# Patient Record
Sex: Female | Born: 1985 | Hispanic: Yes | Marital: Single | Smoking: Never smoker
Health system: Southern US, Community
[De-identification: ages and names within clinical notes are randomized; demographics above are authoritative.]

## PROBLEM LIST (undated history)

## (undated) DIAGNOSIS — O24419 Gestational diabetes mellitus in pregnancy, unspecified control: Secondary | ICD-10-CM

## (undated) HISTORY — DX: Gestational diabetes mellitus in pregnancy, unspecified control: O24.419

## (undated) HISTORY — PX: CERVICAL CERCLAGE: SHX1329

---

## 2021-04-04 ENCOUNTER — Ambulatory Visit (INDEPENDENT_AMBULATORY_CARE_PROVIDER_SITE_OTHER): Payer: Medicaid Other | Admitting: Family Medicine

## 2021-04-04 ENCOUNTER — Other Ambulatory Visit: Payer: Self-pay

## 2021-04-04 VITALS — BP 117/67 | HR 84 | Ht 66.75 in | Wt 237.3 lb

## 2021-04-04 DIAGNOSIS — O021 Missed abortion: Secondary | ICD-10-CM

## 2021-04-04 DIAGNOSIS — Z3201 Encounter for pregnancy test, result positive: Secondary | ICD-10-CM | POA: Diagnosis not present

## 2021-04-04 DIAGNOSIS — Z3687 Encounter for antenatal screening for uncertain dates: Secondary | ICD-10-CM

## 2021-04-04 DIAGNOSIS — O219 Vomiting of pregnancy, unspecified: Secondary | ICD-10-CM

## 2021-04-04 LAB — POCT PREGNANCY, URINE: Preg Test, Ur: POSITIVE — AB

## 2021-04-04 MED ORDER — PROMETHAZINE HCL 25 MG PO TABS: 25.0000 mg | ORAL_TABLET | Freq: Four times a day (QID) | ORAL | 0 refills | Status: DC | PRN

## 2021-04-04 NOTE — Progress Notes (Signed)
Chart reviewed for nurse visit. Agree with plan of care.   Patient seen to establish prenatal care. Depending on dates at time of dating ultrasound will need cervical length and MFM consult as well. ROI for records from [place], [place].  Venora Maples, MD 04/04/2021 1:07 PM

## 2021-04-04 NOTE — Progress Notes (Signed)
Here for pregnancy test which was positive. Reports LMP approximately 02/08/21 within days.  States she has irregular periods. States has a one year old, breastfeed x 5 months and only had 2 periods. Is sure LMP around 02/08/21. This makes her approximately [redacted]w[redacted]d with EDD 11/15/21. Advised to start prenatal vitamins, Advised to start prenatal care. List given. Dr.Eckstat in to see patient since she has not been seen in our office before. and ordered dating [place]. And to check cervical length due to hx having cerclage. Also reports bleeding throughout 2nd pregnancy.  States 2nd pregnancy baby born around 19-20 weeks and died. States had cerclage with 3rd pregnancy and bedrest. Advised once she has had dating [place] and we are sure of her EDD she can make appt to start prenatal care. She voices understanding. C/o nausea and vomiting , request medication.Phenergan ordered per protocol.  Mylo Choi,RN

## 2021-04-04 NOTE — Patient Instructions (Signed)
Prenatal Care Providers           [facility]  [address] [phone]  [facility]   [address]  [phone]  [facility] [place]       [address] [phone]            [facility] @ [place]     [address] [phone]          [facility] [place]   [address] [phone]  [facility]  [address] [phone]     [facility]  [address]   [phone]     [facility]  Phone: [phone]  [facility]  Phone: [phone]  [facility] Phone: [phone]  [facility] Phone: [phone]  [facility] Phone: [phone]  [facility] Phone: [phone]  [facility]  Phone: [phone]  

## 2021-04-10 ENCOUNTER — Ambulatory Visit
Admission: RE | Admit: 2021-04-10 | Discharge: 2021-04-10 | Disposition: A | Discharge status: Home / Self Care | Payer: Medicaid Other | Source: Ambulatory Visit | Attending: Family Medicine | Admitting: Family Medicine

## 2021-04-10 ENCOUNTER — Other Ambulatory Visit: Payer: Self-pay

## 2021-04-10 ENCOUNTER — Telehealth: Payer: Self-pay | Admitting: Obstetrics and Gynecology

## 2021-04-10 DIAGNOSIS — O021 Missed abortion: Secondary | ICD-10-CM | POA: Diagnosis present

## 2021-04-10 DIAGNOSIS — Z3687 Encounter for antenatal screening for uncertain dates: Secondary | ICD-10-CM | POA: Diagnosis present

## 2021-04-10 NOTE — Telephone Encounter (Signed)
I called Katherine Alvarez today at 5:10 PM and confirmed patient's identity using two patient identifiers. [place] results from earlier today were reviewed. Patient is not yet scheduled for new OB . First trimester warning signs reviewed. Patient voiced understanding and had no further questions.   US OB LESS THAN 14 WEEKS WITH OB TRANSVAGINAL  Result Date: 04/10/2021 CLINICAL DATA:  Unsure of LMP (last menstrual period) as reason for ultrasound scan. Early pregnancy. EXAM: OBSTETRIC <14 WK [place] AND TRANSVAGINAL OB US TECHNIQUE: Both transabdominal and transvaginal ultrasound examinations were performed for complete evaluation of the gestation as well as the maternal uterus, adnexal regions, and pelvic cul-de-sac. Transvaginal technique was performed to assess early pregnancy. COMPARISON:  None. FINDINGS: Intrauterine gestational sac: Single Yolk sac:  Yes Embryo:  Yes Cardiac Activity: Yes Heart Rate: 146 bpm CRL:  12.5 mm   7 w   4 d                  [place] EDC: 11/23/2021 Subchorionic hemorrhage:  None visualized. Maternal uterus/adnexae: Right ovary: Normal Left ovary: Normal Other :None Free fluid:  Trace IMPRESSION: 1. Single living intrauterine gestation with an estimated gestational age [redacted] weeks 4 days. No complications Electronically Signed   By: Signa Kell M.D.   On: 04/10/2021 08:36    Marciana Uplinger, Harolyn Rutherford, NP 04/10/2021 5:10 PM

## 2021-04-20 DIAGNOSIS — Z3480 Encounter for supervision of other normal pregnancy, unspecified trimester: Secondary | ICD-10-CM | POA: Insufficient documentation

## 2021-04-24 ENCOUNTER — Ambulatory Visit (INDEPENDENT_AMBULATORY_CARE_PROVIDER_SITE_OTHER): Payer: Medicaid Other

## 2021-04-24 DIAGNOSIS — Z3480 Encounter for supervision of other normal pregnancy, unspecified trimester: Secondary | ICD-10-CM

## 2021-04-24 DIAGNOSIS — Z3490 Encounter for supervision of normal pregnancy, unspecified, unspecified trimester: Secondary | ICD-10-CM

## 2021-04-24 DIAGNOSIS — Z3A00 Weeks of gestation of pregnancy not specified: Secondary | ICD-10-CM

## 2021-04-24 MED ORDER — BLOOD PRESSURE KIT DEVI: 1.0000 | 0 refills | Status: DC

## 2021-04-24 MED ORDER — PRENATE MINI 18-0.6-0.4-350 MG PO CAPS: 1.0000 | ORAL_CAPSULE | Freq: Every day | ORAL | 12 refills | Status: DC

## 2021-04-24 MED ORDER — GOJJI WEIGHT SCALE MISC: 1.0000 | 0 refills | Status: DC

## 2021-04-24 NOTE — Progress Notes (Signed)
Patient was assessed and managed by nursing staff during this encounter. I have reviewed the chart and agree with the documentation and plan.   Jaynie Collins, MD 04/24/2021 4:16 PM

## 2021-04-24 NOTE — Progress Notes (Signed)
New OB Intake  I connected with  Katherine Alvarez on 04/24/21 at  2:00 PM EDT with the aid of a Spanish Interpreter Katherine Alvarez [phone] by telephone Video Visit and verified that I am speaking with the correct person using two identifiers. Nurse is located at [facility] and pt is located at HOME.  I discussed the limitations, risks, security and privacy concerns of performing an evaluation and management service by telephone and the availability of in person appointments. I also discussed with the patient that there may be a patient responsible charge related to this service. The patient expressed understanding and agreed to proceed.  I explained I am completing New OB Intake today. We discussed her EDD of 11/15/2021 that is based on LMP of 02/08/2021. Pt is G4/P2. I reviewed her allergies, medications, Medical/Surgical/OB history, and appropriate screenings. I informed her of [facility] services. Based on history, this is a/an  pregnancy uncomplicated .   Patient Active Problem List   Diagnosis Date Noted   Supervision of other normal pregnancy, antepartum 04/20/2021   IUFD at less than 20 weeks of gestation 04/04/2021    Concerns addressed today  Delivery Plans:  Plans to deliver at [facility].   MyChart/Babyscripts MyChart access verified. I explained pt will have some visits in office and some virtually. Babyscripts instructions given and order placed. Patient verifies receipt of registration text/e-mail. Account successfully created and app downloaded.  Blood Pressure Cuff  Blood pressure cuff ordered for patient to pick-up from [employer]. Explained after first prenatal appt pt will check weekly and document in Babyscripts.  Weight scale: Patient    have weight scale. Weight scale ordered for patient to pick up form [facility].   Anatomy US Explained first scheduled [place] will be around 19 weeks.   Labs Discussed Avelina Laine genetic screening with patient. Would like both  Panorama and Horizon drawn at new OB visit. Routine prenatal labs needed.  Covid Vaccine Patient has covid vaccine.   Mother/ Baby Dyad Candidate?    If yes, offer as possibility  Informed patient of [facility] website  and placed link in her AVS.   Social Determinants of Health Food Insecurity: Patient denies food insecurity. WIC Referral: Patient is interested in referral to [facility].  Transportation: Patient denies transportation needs. Childcare: Discussed no children allowed at ultrasound appointments. Offered childcare services; patient declines childcare services at this time.   Placed OB Box on problem list and updated  First visit review I reviewed new OB appt with pt. I explained she will have a pelvic exam, ob bloodwork with genetic screening, and PAP smear. Explained pt will be seen by Dr. Jolayne Panther 05/02/2021 at first visit; encounter routed to appropriate provider. Explained that patient will be seen by pregnancy navigator following visit with provider. [facility] information placed in AVS.   Maretta Bees, RMA 04/24/2021  2:42 PM

## 2021-05-02 ENCOUNTER — Ambulatory Visit (INDEPENDENT_AMBULATORY_CARE_PROVIDER_SITE_OTHER): Payer: Medicaid Other | Admitting: Obstetrics and Gynecology

## 2021-05-02 ENCOUNTER — Other Ambulatory Visit (HOSPITAL_COMMUNITY)
Admission: RE | Admit: 2021-05-02 | Discharge: 2021-05-02 | Disposition: A | Discharge status: Home / Self Care | Payer: Medicaid Other | Source: Ambulatory Visit | Attending: Obstetrics and Gynecology | Admitting: Obstetrics and Gynecology

## 2021-05-02 ENCOUNTER — Encounter: Payer: Self-pay | Admitting: Obstetrics and Gynecology

## 2021-05-02 ENCOUNTER — Other Ambulatory Visit: Payer: Self-pay

## 2021-05-02 VITALS — BP 121/90 | HR 85 | Wt 240.9 lb

## 2021-05-02 DIAGNOSIS — O99210 Obesity complicating pregnancy, unspecified trimester: Secondary | ICD-10-CM

## 2021-05-02 DIAGNOSIS — Z8632 Personal history of gestational diabetes: Secondary | ICD-10-CM

## 2021-05-02 DIAGNOSIS — Z3480 Encounter for supervision of other normal pregnancy, unspecified trimester: Secondary | ICD-10-CM

## 2021-05-02 DIAGNOSIS — O09299 Supervision of pregnancy with other poor reproductive or obstetric history, unspecified trimester: Secondary | ICD-10-CM

## 2021-05-02 DIAGNOSIS — Z789 Other specified health status: Secondary | ICD-10-CM | POA: Diagnosis not present

## 2021-05-02 DIAGNOSIS — Z3A10 10 weeks gestation of pregnancy: Secondary | ICD-10-CM | POA: Insufficient documentation

## 2021-05-02 DIAGNOSIS — Z603 Acculturation difficulty: Secondary | ICD-10-CM

## 2021-05-02 DIAGNOSIS — Z3481 Encounter for supervision of other normal pregnancy, first trimester: Secondary | ICD-10-CM | POA: Insufficient documentation

## 2021-05-02 LAB — POCT URINALYSIS DIPSTICK
Bilirubin, UA: NEGATIVE
Glucose, UA: NEGATIVE
Ketones, UA: NEGATIVE
Nitrite, UA: NEGATIVE
Protein, UA: NEGATIVE
Spec Grav, UA: <=1.005 — AB (ref 1.010–1.025)
Urobilinogen, UA: 0.2 E.U./dL
pH, UA: 7.5 (ref 5.0–8.0)

## 2021-05-02 MED ORDER — ASPIRIN EC 81 MG PO TBEC: 81.0000 mg | DELAYED_RELEASE_TABLET | Freq: Every day | ORAL | 2 refills | Status: DC

## 2021-05-02 MED ORDER — PROMETHAZINE HCL 25 MG PO TABS: 25.0000 mg | ORAL_TABLET | Freq: Four times a day (QID) | ORAL | 2 refills | Status: DC | PRN

## 2021-05-02 NOTE — Progress Notes (Signed)
  Subjective:    Katherine Alvarez is a [id number] [redacted]w[redacted]d being seen today for her first obstetrical visit.  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 Patient does intend to breast feed. Pregnancy history fully reviewed.  Patient reports nausea.  Vitals:   05/02/21 1035  BP: 121/90  Pulse: 85  Weight: 240 lb 14.4 oz (109.3 kg)    HISTORY: OB History  Gravida Para Term Preterm AB Living  4 2 2   1 2   SAB IAB Ectopic Multiple Live Births  1       2    # Outcome Date GA Lbr Len/2nd Weight Sex Delivery Anes PTL Lv  4 Current           3 Term 03/31/20   7 lb 11 oz (3.487 kg) M Vag-Spont   LIV  2 SAB 04/14/18        FD  1 Term 06/22/12   8 lb 7 oz (3.827 kg) F Vag-Spont  N LIV   History reviewed. No pertinent past medical history. History reviewed. No pertinent surgical history. History reviewed. No pertinent family history.   Exam    Uterus:   12-weeks  Pelvic Exam:    Perineum: Normal Perineum   Vulva: normal   Vagina:  normal mucosa, normal discharge   pH:    Cervix: multiparous appearance and closed and long   Adnexa: no mass, fullness, tenderness   Bony Pelvis: gynecoid  System: Breast:  normal appearance, no masses or tenderness   Skin: normal coloration and turgor, no rashes    Neurologic: oriented, no focal deficits   Extremities: normal strength, tone, and muscle mass   HEENT extra ocular movement intact   Mouth/Teeth mucous membranes moist, pharynx normal without lesions and dental hygiene good   Neck supple and no masses   Cardiovascular: regular rate and rhythm   Respiratory:  appears well, vitals normal, no respiratory distress, acyanotic, normal RR, chest clear, no wheezing, crepitations, rhonchi, normal symmetric air entry   Abdomen: soft, non-tender; bowel sounds normal; no masses,  no organomegaly   Urinary:       Assessment:     Pregnancy: 08/22/12 Patient Active Problem List   Diagnosis Date Noted   History of gestational diabetes in prior pregnancy, currently pregnant 05/02/2021   History of incompetent cervix, currently pregnant 05/02/2021   Language barrier affecting health care 05/02/2021   Maternal obesity affecting pregnancy, antepartum 05/02/2021   Supervision of other normal pregnancy, antepartum 04/20/2021   IUFD at less than 20 weeks of gestation 04/04/2021        Plan:     Initial labs drawn. A1C included Prenatal vitamins. Problem list reviewed and updated. Genetic Screening discussed : panorama ordered.  Ultrasound discussed; fetal survey: requested. Cervical length ordered Discussed repeat cerclage placement- patient desires to wait until ultrasound Rx ASA provided Rx phenergan provided  Follow up in 4 weeks. 50% of 30 min visit spent on counseling and coordination of care.     Telena Peyser 05/02/2021

## 2021-05-02 NOTE — Patient Instructions (Signed)
Obstetrics: Normal and Problem Pregnancies (7th ed., pp. 102-121). [place], [place]: Elsevier."> Textbook of Family Medicine (9th ed., pp. [phone]). [place]: Restaurant manager, fast food trimestre de Psychiatrist First Trimester of Pregnancy  El primer trimestre de Community education officer de su ltimo periodo menstrual hasta el final de la semana 12. Es Designer, jewellery desde el mes 1 hasta el mes 3 de [place]. Una semana despus de que un espermatozoide fecunda un vulo, este se implantar en la pared uterina y comenzar a desarrollarse hasta convertirse en un beb. Al final de las 12 semanas, se formarn todos losrganos del beb y el beb tendr un tamao de entre 2 y 3 pulgadas. Durante Financial risk analyst trimestre, ocurren cambios en el cuerpo Su organismo atraviesa por muchos cambios durante el [place]. Los cambiosvaran y generalmente vuelven a la normalidad despus del nacimiento del beb. Cambios fsicos Usted puede aumentar o bajar de [place]. Las [employer]ra Foods pueden empezar a Government social research officer y Emergency planning/management officer. El tejido que rodea los pezones (arola) puede tornarse ms oscuro. Pueden aparecer zonas oscuras o manchas (cloasma o mscara del embarazo) en el rostro. Tal vez haya cambios en el cabello. Estos pueden incluir engrosamiento, afinamiento y [employer] Waste Industries textura. Cambios en la salud Quizs sienta nuseas y es posible que vomite. Es posible que tenga [place] estomacal. Comienza a tener dolores de [place]. Puede tener estreimiento. Las Veterinary surgeon y estar sensibles al cepillado y al hilo dental. Otros cambios Puede cansarse con facilidad. Puede orinar con mayor frecuencia. Los perodos menstruales se interrumpirn. Tal vez no tenga apetito. Puede sentir un fuerte deseo de consumir ciertos alimentos. Puede tener cambios en sus emociones de un da para Therapist, art. Tendr sueos ms vvidos y extraos. Siga estas instrucciones en su casa: Medicamentos Siga las instrucciones del mdico en  relacin con el uso de medicamentos. Durante el embarazo, hay medicamentos que pueden tomarse y otros que no. No use ningn medicamento a menos que se los haya indicado el mdico. Tome vitaminas prenatales que contengan por lo menos (mcg) de cido flico. Comida y bebida Lleve una dieta saludable que incluya frutas y verduras frescas, cereales integrales, buenas fuentes de protenas como carnes [place], huevos o tofu, y productos lcteos descremados. Evite la carne cruda y el [place], la [place] y el queso sin Market researcher. Estos portan grmenes que pueden provocar dao tanto a usted como al beb. Siente nuseas o vomita: Ingiera 4 o 5comidas pequeas por Geophysical data processor de 3abundantes. Intente comer algunas galletitas saladas. Beba lquidos [employer], en lugar de Sports coach. Es posible que tenga que tomar estas medidas para prevenir o tratar el estreimiento: Product manager suficiente lquido como para Pharmacologist la orina de color amarillo plido. Consumir alimentos ricos en fibra, como frijoles, cereales integrales, y frutas y verduras frescas. Limitar el consumo de alimentos ricos en grasa y azcares procesados, como los alimentos fritos o dulces. Actividad Haga ejercicio solamente como se lo haya indicado el mdico. La mayora de las personas pueden continuar su rutina de ejercicios durante el [place]. Intente realizar como mnimo de actividad fsica por lo menos 5das a la semana. Deje de hacer ejercicio si le aparecen dolor o clicos en la parte baja del vientre o de la espalda. Evite hacer ejercicio si hace mucho calor o humedad, o si se encuentra a una altitud elevada. Evite levantar pesos [employer]ands. Si lo desea, puede seguir teniendo [employer], salvo que el mdico le indique lo contrario. Alivio del dolor y del [place]  Use un sostn que le brinde buen soporte para Engineer, materials de [place]. Descanse con las piernas elevadas si tiene calambres o  dolor de cintura. Si desarrolla venas abultadas (vrices) en las piernas: Use medias de compresin como se lo haya indicado el mdico. Eleve los pies durante , 3 o 4veces por da. Limite el consumo de sal en su dieta. Seguridad Use el cinturn de seguridad en todo momento mientras conduce o va en auto. Hable con el mdico si es vctima de [employer] o fsico. Hable con el mdico si se siente triste o tiene pensamientos acerca de [place] dao a usted misma. Estilo de vida No se d baos de inmersin en agua caliente, baos turcos ni saunas. No se haga duchas vaginales. No use tampones ni toallas higinicas perfumadas. No use remedios a base de hierbas, alcohol, drogas ilegales ni medicamentos que no estn aprobados por el mdico. Las sustancias qumicas de estos productos pueden daar al beb. No consuma ningn producto que contenga nicotina o tabaco, como cigarrillos, cigarrillos electrnicos y tabaco de Theatre manager. Si necesita ayuda para dejar de fumar, consulte al mdico. Evite el contacto con las bandejas sanitarias de los gatos y la tierra que estos animales usan. Estos elementos contienen bacterias que pueden causar defectos congnitos al beb y la posible prdida del beb en gestacin (feto) debido a un aborto espontneo o muerte fetal. Instrucciones generales Durante las visitas prenatales de rutina del Engineer, maintenance trimestre, el mdico le har un examen fsico, Education officer, environmental las pruebas necesarias y le preguntar cmo Merchant navy officer las cosas. Cumpla con todas las visitas de seguimiento. Esto es importante. Pida ayuda si tiene necesidades nutricionales o de asesoramiento Academic librarian. El mdico puede aconsejarla o derivarla a especialistas para que la ayuden con diferentes necesidades. Programe una cita con el dentista. En su casa, lvese los dientes con un cepillo dental suave. Psese el hilo dental suavemente. Escriba sus preguntas. Llvelas cuando concurra a las visitas prenatales. Dnde  buscar ms informacin American Pregnancy Association (Asociacin Americana del Embarazo): americanpregnancy.org Celanese Corporation of Obstetricians and Gynecologists (Colegio Estadounidense de Obstetras y [place]): EmploymentAssurance.cz? Office on Pitney Bowes (Oficina para la Salud de la Mujer): [email or web address] Comunquese con un mdico si tiene: Research scientist (life sciences). Grant Ruts. Clicos leves, presin en la pelvis o dolor persistente en el abdomen. Nuseas, vmitos o diarrea que duran 24 horas o ms. Una secrecin vaginal con mal olor. Dolor al Beatrix Shipper. Una enfermedad contagiosa, como varicela, sarampin, virus de [place], VIH o hepatitis. Solicite ayuda inmediatamente si: Tiene manchado o sangrado de la vagina. Tiene dolor intenso o clicos en el abdomen. Siente que le falta el aire o dolor en el pecho. Sufre cualquier tipo de traumatismo, por ejemplo, debido a una cada o un accidente automovilstico. Dolor, hinchazn o enrojecimiento nuevos en un brazo o una pierna, o un aumento de alguno de estos sntomas. Resumen Financial risk analyst trimestre del Community education officer de su ltimo periodo menstrual hasta el final de la semana 12 (meses 1 al 3). Hacer 4 o 5 comidas pequeas al Geophysical data processor de 3 comidas grandes tambin puede aliviar las nuseas y los vmitos. No consuma ningn producto que contenga nicotina o tabaco, como cigarrillos, cigarrillos electrnicos y tabaco de Theatre manager. Si necesita ayuda para dejar de fumar, consulte al mdico. Cumpla con todas las visitas de seguimiento. Esto es importante. Esta informacin no tiene Theme park manager el consejo del mdico. Asegresede hacerle al mdico cualquier pregunta que tenga. Document Revised: 03/07/2020 Document Reviewed: 03/07/2020 Elsevier  Patient Education  2022 Arvin[employer]itor.  [place] trimestre de Psychiatrist Second Trimester of Pregnancy  El segundo trimestre de Psychiatrist va desde la semana 13 hasta la semana 27. Es Designer, jewellery  desde el mes 4 hasta el mes 6 de [place]. El segundo trimestre suele ser el momento en el que mejor se siente. Su organismo se ha adaptado a Hydrologist, y comienza a Diplomatic Services operational officer. Durante el segundo trimestre: Las nuseas del embarazo han disminuido o han desaparecido completamente. Usted puede tener ms energa. Es posible que tenga un aumento del apetito. El segundo trimestre es tambin un perodo en el que el beb en gestacin (feto) crece rpidamente. Hacia el final del sexto mes, el feto puede medir aproximadamente 12 pulgadas y pesar alrededor de 1 libras. Es probable que sienta que el beb se Teacher, English as a foreign language (da pataditas) entre las 16 y 20semanas del Psychiatrist. Cambios en el cuerpo durante el segundo trimestre Su cuerpo continua experimentando numerosos cambios durante su segundo trimestre. Los cambios varan y generalmente vuelven a la normalidad despusdel nacimiento del beb. Cambios fsicos Seguir American Standard Companies. Notar que la parte baja del abdomen sobresale. Podrn aparecer las primeras Albertson's caderas, el abdomen y las [place]. Las ConAgra Foods seguirn creciendo y se tornarn sensibles. Pueden aparecer zonas oscuras o manchas (cloasma o mscara del embarazo) en el rostro. Es posible que se forme una lnea oscura desde el ombligo hasta la zona del pubis (linea nigra). Tal vez haya cambios en el cabello. Esto cambios pueden incluir su engrosamiento, crecimiento rpido y [employer] Waste Industries textura. A algunas personas tambin se les cae el cabello durante o despus del [place], o tienen el cabello seco o fino. Cambios en la salud Comienza a tener dolores de [place]. Es posible que tenga acidez estomacal. Puede tener estreimiento. Pueden aparecer hemorroides o abultarse e hincharse las venas (venas varicosas). Las Veterinary surgeon y estar sensibles al cepillado y al hilo dental. Nicanor Bake vez tenga necesidad de orinar con ms frecuencia porque el feto est ejerciendo presin  sobre la vejiga. Puede sentir dolor en la espalda. Esto se debe a: Aumento de peso. Las hormonas del Management consultant las articulaciones en la pelvis. Un cambio en el peso y los msculos que ayudan a Pharmacologist su equilibrio. Siga estas instrucciones en su casa: Medicamentos Siga las instrucciones del mdico en relacin con el uso de medicamentos. Durante el embarazo, hay medicamentos que pueden tomarse y otros que no. No tome medicamentos a menos que lo haya autorizado el mdico. Tome vitaminas prenatales que contengan por lo menos (mcg) de cido flico. Comida y bebida Lleve una dieta saludable que incluya frutas y verduras frescas, cereales integrales, buenas fuentes de protenas como carnes [place], huevos o tofu, y productos lcteos descremados. Evite la carne cruda y el [place], la [place] y el queso sin Market researcher. Estos portan grmenes que pueden provocar dao tanto a usted como al beb. Es posible que tenga que tomar estas medidas para prevenir o tratar el estreimiento: Product manager suficiente lquido como para Pharmacologist la orina de color amarillo plido. Consumir alimentos ricos en fibra, como frijoles, cereales integrales, y frutas y verduras frescas. Limitar el consumo de alimentos ricos en grasa y azcares procesados, como los alimentos fritos o dulces. Actividad Haga ejercicio solamente como se lo haya indicado el mdico. La mayora de las personas pueden continuar su rutina de ejercicios durante el [place]. Intente realizar como mnimo de actividad fsica por lo menos 5das a la semana. Deje de hacer ejercicio  si comienza a Teacher, music. Deje de hacer ejercicio si le aparecen dolor o clicos en la parte baja del vientre o de la espalda. Evite hacer ejercicio si hace mucho calor o humedad, o si se encuentra a una altitud elevada. Evite levantar pesos [employer]. Si lo desea, puede seguir teniendo [employer], salvo que el mdico le indique  lo contrario. Alivio del dolor y del Dentist Use un sujetador que le brinde buen soporte para prevenir las molestias causadas por la sensibilidad en las [place]. Dese baos de asiento con agua tibia para Engineer, materials o las molestias causadas por las hemorroides. Use una crema para las hemorroides si el mdico la autoriza. Descanse con las piernas levantadas (elevadas) si tiene calambres en las piernas o dolor en la parte baja de la espalda. Si tiene venas varicosas: Use medias de compresin como se lo haya indicado el mdico. Eleve los pies durante , 3 o 4veces por Futures trader. Limite el consumo de sal en su dieta. Seguridad Use el cinturn de seguridad en todo momento mientras conduce o va en auto. Hable con el mdico si es vctima de [employer] o fsico. Estilo de vida No se d baos de inmersin en agua caliente, baos turcos ni saunas. No se haga duchas vaginales. No use tampones ni toallas higinicas perfumadas. Evite el contacto con las bandejas sanitarias de los gatos y la tierra que estos animales usan. Estos elementos contienen bacterias que pueden causar defectos congnitos al beb y la posible prdida del feto debido a un aborto espontneo o muerte fetal. No use remedios a base de hierbas, alcohol, drogas ilegales ni medicamentos que no estn aprobados por el mdico. Las sustancias qumicas de estos productos pueden daar al beb. No consuma ningn producto que contenga nicotina o tabaco, como cigarrillos, cigarrillos electrnicos y tabaco de Theatre manager. Si necesita ayuda para dejar de fumar, consulte al mdico. Instrucciones generales Durante una visita prenatal de rutina, el [employer] har un examen fsico y Probation officer. Tambin le hablar sobre su salud general. Cumpla con todas las visitas de seguimiento. Esto es importante. Pdale al mdico que la derive a clases de educacin prenatal en su localidad. Pida ayuda si tiene necesidades nutricionales o de asesoramiento  Academic librarian. El mdico puede aconsejarla o derivarla a especialistas para que la ayuden con diferentes necesidades. Dnde buscar ms informacin American Pregnancy Association (Asociacin Americana del Embarazo): americanpregnancy.org Celanese Corporation of Obstetricians and Gynecologists (Colegio Estadounidense de Obstetras y [place]): EmploymentAssurance.cz? Office on Pitney Bowes (Oficina para la Salud de la Mujer): [email or web address] Comunquese con un mdico si tiene: Un dolor de cabeza que no desaparece despus de Science writer. Cambios en la visin o ve manchas delante de los ojos. Clicos leves, presin en la pelvis o dolor persistente en el abdomen. Nuseas persistentes, vmitos o diarrea. Secrecin vaginal con mal olor u orina con [employer] ftido. Dolor al Beatrix Shipper. Hinchazn sbita o extrema del rostro, las [address], los tobillos, los pies o las piernas. Grant Ruts. Busque ayuda de inmediato si: Tiene una prdida de lquido por la vagina. Tiene sangrado ligero o manchas vaginales. Tiene dolor intenso o clicos en el abdomen. Presenta dificultad para respirar. Siente dolor en el pecho. Tiene episodios de [employer]. No ha sentido a su beb moverse durante el perodo de [employer] indic el mdico. Tiene dolor, hinchazn o enrojecimiento nuevos o ms intensos en un brazo o una pierna. Resumen El segundo trimestre de embarazo va desde la semana 13 hasta la  27 (desde el mes 4 hasta el 6). No use remedios a base de hierbas, alcohol, drogas ilegales ni medicamentos que no estn aprobados por el mdico. Las sustancias qumicas de estos productos pueden daar al beb. Haga ejercicio solamente como se lo haya indicado el mdico. La mayora de las personas pueden continuar su rutina de ejercicios durante el [place]. Cumpla con todas las visitas de seguimiento. Esto es importante. Esta informacin no tiene Theme park managercomo fin reemplazar el consejo del mdico. Asegresede hacerle  al mdico cualquier pregunta que tenga. Document Revised: 03/14/2020 Document Reviewed: 03/14/2020 Elsevier Patient Education  2022 Elsevier Inc.  Southern CompanyEleccin del mtodo anticonceptivo Contraception Choices La anticoncepcin, o los mtodos anticonceptivos, hace referencia a los mtodoso dispositivos que evitan el [place]embarazo. Mtodos hormonales  Implante anticonceptivo Un implante anticonceptivo consiste en un tubo delgado de plstico que contiene una hormona que evita el [place]em[place]. Es diferente de un dispositivo intrauterino (DIU). Un mdico lo inserta en la parte superior del brazo. Los implantespueden ser eficaces durante un mximo de 3 aos. Inyecciones de progestina sola Las inyecciones de progestina sola contienen progestina, una forma sinttica dela hormona progesterona. Un mdico las administra cada 3 meses. Pldoras anticonceptivas Las pldoras anticonceptivas son pastillas que contienen hormonas que evitan el Stevensonembarazo. Deben tomarse una vez al da, preferentemente a la misma hora cadada. Se necesita una receta para utilizar este mtodo anticonceptivo. Parche anticonceptivo El parche anticonceptivo contiene hormonas que evitan el Harmonyembarazo. Se coloca en la piel, debe cambiarse una vez a la semana durante tres semanas y debe retirarse en la cuarta semana. Se necesita una receta para Scientific laboratory technicianutilizar este mtodoanticonceptivo. Anillo vaginal Un anillo vaginal contiene hormonas que evitan el embarazo. Se coloca en la vagina durante tres semanas y se retira en la cuarta semana. Luego se repite el proceso con un anillo nuevo. Se necesita una receta para Scientific laboratory technicianutilizar este mtodoanticonceptivo. Anticonceptivo de emergencia Los anticonceptivos de emergencia son mtodos para evitar un embarazo despus de Warehouse managertener sexo sin proteccin. Vienen en forma de pldora y pueden tomarse hasta 5 das despus de Pleasant Daletener sexo. Funcionan mejor cuando se toman lo ms pronto posible luego de eBaytener sexo. La mayora de los  anticonceptivos de emergencia estn disponibles sin receta mdica. Este mtodo no debe utilizarse como elnico mtodo anticonceptivo. Mtodos de barrera  Condn masculino Un condn masculino es una vaina delgada que se coloca sobre el pene durante el sexo. Los condones evitan que el esperma ingrese en el cuerpo de la Kib[place]ermujer. Pueden utilizarse con un una sustancia que mata a los espermatozoides (espermicida) para aumentar la efectividad. Deben desecharse despus de un uso. Condn femenino Un condn femenino es una vaina blanda y holgada que se coloca en la vagina antes de Jeffersonvilletener sexo. El condn evita que el esperma ingrese en el cuerpo de Insurance claims handlerlamujer. Deben desecharse despus de un uso. Diafragma Un diafragma es una barrera blanda con forma de cpula. Se inserta en la vagina antes del sexo, junto con un espermicida. El diafragma bloquea el ingreso de esperma en el tero, y el espermicida mata a los espermatozoides. El Designer, fashion/clothingdiafragma debe permanecer en la vagina durante 6 a 8 horas despus de Warehouse managertener sexo y deberetirarse en el plazo de las 24 horas. Un diafragma es recetado y colocado por un mdico. Debe reemplazarse cada 1 a 2 aos, despus de dar a luz, de aumentar ms de 15lb (6.8kg) y de Guyanauna cirugaplvica. Capuchn cervical Un capuchn cervical es una copa redonda y blanda de ltex o plstico que se coloca en  el cuello uterino. Se inserta en la vagina antes del sexo, junto con un espermicida. Bloquea el ingreso del esperma en el tero. El capuchn Radio producer durante 6 a 8 horas despus de Warehouse manager sexo y debe retirarse en el plazo de las 48 horas. Un capuchn cervical debe ser recetado ycolocado por un mdico. Debe reemplazarse cada 2aos. Esponja Una esponja es una pieza blanda y circular de espuma de poliuretano que contiene espermicida. La esponja ayuda a bloquear el ingreso de esperma en el tero, y el espermicida mata a los espermatozoides. Belva Bertin, debe humedecerla e insertarla en  la vagina. Debe insertarse antes de eBay, debe permanecer dentro al menos durante 6 horas despus de Warehouse manager sexo y deberetirarse y Nurse, adult en el plazo de las 30 horas. Espermicidas Los espermicidas son sustancias qumicas que matan o bloquean al esperma y no lo dejan ingresar al cuello uterino y al tero. Vienen en forma de crema, gel, supositorio, espuma o comprimido. Un espermicida debe insertarse en la vagina con un aplicador al menos 10 o 15 minutos antes de tener sexo para dar tiempo a que surta [place]. El proceso debe repetirse cada vez que tenga sexo. Losespermicidas no requieren receta mdica. Anticonceptivos intrauterinos Dispositivo intrauterino (DIU) Un DIU es un dispositivo en forma de T que se coloca en el tero. Existen dos tipos: DIU hormonal.Este tipo contiene progestina, una forma sinttica de la hormona progesterona. Este tipo puede permanecer colocado durante 3 a 5 aos. DIU de cobre.Este tipo est recubierto con un alambre de cobre. Puede permanecer colocado durante 10 aos. Mtodos anticonceptivos permanentes Ligadura de trompas en la mujer En este mtodo, se sellan, atan u obstruyen las trompas de Falopio durante [place] para Automotive engineer que el vulo descienda hacia el tero. Esterilizacin histeroscpica En este mtodo, se coloca un implante pequeo y flexible dentro de cada trompa de Falopio. Los implantes hacen que se forme un tejido cicatricial en las trompas de Falopio y que las obstruya para que el espermatozoide no pueda llegar al vulo. El procedimiento demora alrededor de 3 meses para que seaefectivo. Debe utilizarse otro mtodo anticonceptivo durante esos 3 meses. Esterilizacin masculina Este es un procedimiento que consiste en atar los conductos que transportan el esperma (vasectoma). Luego del procedimiento, el hombre Manufacturing engineer lquido (semen). Debe utilizarse otro mtodo anticonceptivo durante 3 meses despus delprocedimiento. Mtodos de planificacin  natural Planificacin familiar natural En este mtodo, la pareja no tiene American Family Insurance la mujer podra quedar [place]. Mtodo calendario En este mtodo, la mujer realiza un seguimiento de la duracin de cada ciclo menstrual, identifica los [employer] que se puede producir un Psychiatrist y no tiene sexo durante esos [address]  [address]. Mtodo de la ovulacin En este mtodo, la pareja evita tener sexo durante la ovulacin. Mtodo sintotrmico Este mtodo implica no tener sexo durante la ovulacin. Normalmente, la mujer comprueba la ovulacinal observar cambios en su temperatura y en la consistencia del moco cervical. Mtodo posovulacin En este mtodo, la pareja espera a que finalice la ovulacin para Doctor, hospital. Dnde buscar ms informacin Centers for Disease Control and Prevention (Centros para el Control y Psychiatrist de Event organiser): [email or web address] Resumen La anticoncepcin, o los mtodos anticonceptivos, hace referencia a los mtodos o dispositivos que evitan el [place]. Los mtodos anticonceptivos hormonales incluyen implantes, inyecciones, pastillas, parches, anillos vaginales y anticonceptivos de Associate Professor. Los mtodos anticonceptivos de barrera pueden incluir condones masculinos, condones femeninos, diafragmas, capuchones cervicales, esponjas y espermicidas. [employer] Life Insurance tipos de  DIU (dispositivo intrauterino). Un DIU puede colocarse en el tero de una mujer para evitar el embarazo durante 3 a 5 aos. La esterilizacin permanente puede realizarse mediante un procedimiento tanto en los hombres como en las mujeres. Los [employer] de Medical sales representative natural implican no tener American Family Insurance la mujer podra quedar [place]. Esta informacin no tiene Theme park manager el consejo del mdico. Asegresede hacerle al mdico cualquier pregunta que tenga. Document Revised: 04/05/2020 Document Reviewed: 04/05/2020 Elsevier Patient Education  2022 Arvin[employer]itor.

## 2021-05-02 NOTE — Progress Notes (Signed)
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 Diastolic BP = 90 today. Reports a lot of nausea, food smell aversions. Reports phenergan doesn't help just makes her dizzy. Reports drinking a lot of water and voiding frequently. Reports feeling that bladder is not empty even after voiding.

## 2021-05-03 LAB — CERVICOVAGINAL ANCILLARY ONLY
Bacterial Vaginitis (gardnerella): POSITIVE — AB
Candida Glabrata: NEGATIVE
Candida Vaginitis: NEGATIVE
Chlamydia: NEGATIVE
Comment: NEGATIVE
Comment: NEGATIVE
Comment: NEGATIVE
Comment: NEGATIVE
Comment: NORMAL
Neisseria Gonorrhea: NEGATIVE

## 2021-05-04 LAB — COMPREHENSIVE METABOLIC PANEL
ALT: 12 IU/L (ref 0–32)
AST: 13 IU/L (ref 0–40)
Albumin/Globulin Ratio: 1.8 (ref 1.2–2.2)
Albumin: 4.5 g/dL (ref 3.8–4.8)
Alkaline Phosphatase: 53 IU/L (ref 44–121)
BUN/Creatinine Ratio: 14 (ref 9–23)
BUN: 6 mg/dL (ref 6–20)
Bilirubin Total: 0.3 mg/dL (ref 0.0–1.2)
CO2: 20 mmol/L (ref 20–29)
Calcium: 9.6 mg/dL (ref 8.7–10.2)
Chloride: 100 mmol/L (ref 96–106)
Creatinine, Ser: 0.44 mg/dL — ABNORMAL LOW (ref 0.57–1.00)
Globulin, Total: 2.5 g/dL (ref 1.5–4.5)
Glucose: 80 mg/dL (ref 65–99)
Potassium: 4.2 mmol/L (ref 3.5–5.2)
Sodium: 137 mmol/L (ref 134–144)
Total Protein: 7 g/dL (ref 6.0–8.5)
eGFR: 131 mL/min/{1.73_m2} (ref >59)

## 2021-05-04 LAB — CBC/D/PLT+RPR+RH+ABO+RUBIGG...
Antibody Screen: NEGATIVE
Basophils Absolute: 0 10*3/uL (ref 0.0–0.2)
Basos: 0 %
EOS (ABSOLUTE): 0.1 10*3/uL (ref 0.0–0.4)
Eos: 1 %
HCV Ab: <0.1 s/co ratio (ref 0.0–0.9)
HIV Screen 4th Generation wRfx: NONREACTIVE
Hematocrit: 40.4 % (ref 34.0–46.6)
Hemoglobin: 13.3 g/dL (ref 11.1–15.9)
Hepatitis B Surface Ag: NEGATIVE
Immature Grans (Abs): 0.1 10*3/uL (ref 0.0–0.1)
Immature Granulocytes: 1 %
Lymphocytes Absolute: 2.3 10*3/uL (ref 0.7–3.1)
Lymphs: 22 %
MCH: 30.4 pg (ref 26.6–33.0)
MCHC: 32.9 g/dL (ref 31.5–35.7)
MCV: 92 fL (ref 79–97)
Monocytes Absolute: 0.8 10*3/uL (ref 0.1–0.9)
Monocytes: 7 %
Neutrophils Absolute: 7.4 10*3/uL — ABNORMAL HIGH (ref 1.4–7.0)
Neutrophils: 69 %
Platelets: 225 10*3/uL (ref 150–450)
RBC: 4.37 x10E6/uL (ref 3.77–5.28)
RDW: 12.9 % (ref 11.7–15.4)
RPR Ser Ql: NONREACTIVE
Rh Factor: POSITIVE
Rubella Antibodies, IGG: 4.19 index (ref >0.99)
WBC: 10.7 10*3/uL (ref 3.4–10.8)

## 2021-05-04 LAB — HEMOGLOBIN A1C
Est. average glucose Bld gHb Est-mCnc: 114 mg/dL
Hgb A1c MFr Bld: 5.6 % (ref 4.8–5.6)

## 2021-05-04 LAB — PROTEIN / CREATININE RATIO, URINE
Creatinine, Urine: 37.2 mg/dL
Protein, Ur: 5.8 mg/dL
Protein/Creat Ratio: 156 mg/g creat (ref 0–200)

## 2021-05-04 LAB — HCV INTERPRETATION

## 2021-05-04 LAB — URINE CULTURE, OB REFLEX

## 2021-05-04 LAB — CULTURE, OB URINE

## 2021-05-04 MED ORDER — METRONIDAZOLE 500 MG PO TABS: 500.0000 mg | ORAL_TABLET | Freq: Two times a day (BID) | ORAL | 0 refills | Status: DC

## 2021-05-04 NOTE — Addendum Note (Signed)
Addended by: Catalina Antigua on: 05/04/2021 08:29 AM   Modules accepted: Orders

## 2021-05-05 LAB — CYTOLOGY - PAP
Comment: NEGATIVE
Diagnosis: NEGATIVE
High risk HPV: NEGATIVE

## 2021-05-11 ENCOUNTER — Encounter: Payer: Self-pay | Admitting: Obstetrics and Gynecology

## 2021-05-15 ENCOUNTER — Encounter: Payer: Self-pay | Admitting: Obstetrics and Gynecology

## 2021-05-30 ENCOUNTER — Ambulatory Visit (INDEPENDENT_AMBULATORY_CARE_PROVIDER_SITE_OTHER): Payer: Medicaid Other | Admitting: Obstetrics & Gynecology

## 2021-05-30 ENCOUNTER — Encounter: Payer: Self-pay | Admitting: Obstetrics & Gynecology

## 2021-05-30 ENCOUNTER — Other Ambulatory Visit: Payer: Self-pay

## 2021-05-30 ENCOUNTER — Other Ambulatory Visit (HOSPITAL_COMMUNITY)
Admission: RE | Admit: 2021-05-30 | Discharge: 2021-05-30 | Disposition: A | Discharge status: Home / Self Care | Payer: Medicaid Other | Source: Ambulatory Visit | Attending: Obstetrics & Gynecology | Admitting: Obstetrics & Gynecology

## 2021-05-30 VITALS — BP 117/74 | HR 94 | Wt 244.0 lb

## 2021-05-30 DIAGNOSIS — Z3480 Encounter for supervision of other normal pregnancy, unspecified trimester: Secondary | ICD-10-CM

## 2021-05-30 DIAGNOSIS — O09299 Supervision of pregnancy with other poor reproductive or obstetric history, unspecified trimester: Secondary | ICD-10-CM

## 2021-05-30 DIAGNOSIS — Z23 Encounter for immunization: Secondary | ICD-10-CM | POA: Diagnosis not present

## 2021-05-30 DIAGNOSIS — Z789 Other specified health status: Secondary | ICD-10-CM

## 2021-05-30 DIAGNOSIS — O99210 Obesity complicating pregnancy, unspecified trimester: Secondary | ICD-10-CM

## 2021-05-30 NOTE — Progress Notes (Signed)
   PRENATAL VISIT NOTE  Subjective:  Katherine Alvarez is a 35 y.o. [phone] at [redacted]w[redacted]d being seen today for ongoing prenatal care.  She is currently monitored for the following issues for this high-risk pregnancy and has IUFD at less than 20 weeks of gestation; Supervision of other normal pregnancy, antepartum; History of gestational diabetes in prior pregnancy, currently pregnant; History of incompetent cervix, currently pregnant; Language barrier affecting health care; and Maternal obesity affecting pregnancy, antepartum on their problem list.  Patient reports vaginal irritation.  Contractions: Not present. Vag. Bleeding: None.  Movement: Increased. Denies leaking of fluid.   The following portions of the patient's history were reviewed and updated as appropriate: allergies, current medications, past family history, past medical history, past social history, past surgical history and problem list.   Objective:   Vitals:   05/30/21 0940  BP: 117/74  Pulse: 94  Weight: 244 lb (110.7 kg)    Fetal Status: Fetal Heart Rate (bpm): 147   Movement: Increased     General:  Alert, oriented and cooperative. Patient is in no acute distress.  Skin: Skin is warm and dry. No rash noted.   Cardiovascular: Normal heart rate noted  Respiratory: Normal respiratory effort, no problems with respiration noted  Abdomen: Soft, gravid, appropriate for gestational age.  Pain/Pressure: Absent     Pelvic: Cervical exam deferred        Extremities: Normal range of motion.  Edema: None  Mental Status: Normal mood and affect. Normal behavior. Normal judgment and thought content.   Assessment and Plan:  Pregnancy: [id number] at [redacted]w[redacted]d 1. Supervision of other normal pregnancy, antepartum Vaccine routine - Flu Vaccine QUAD 36+ mos IM (Fluarix, Quad PF) - Cervicovaginal ancillary only( )  2. Maternal obesity affecting pregnancy, antepartum Body mass index is 38.5 kg/m.   3. Language barrier  affecting health care Spanish interpreter  4. History of incompetent cervix, currently pregnant [place] this week and discuss cerclage afterward  Preterm labor symptoms and general obstetric precautions including but not limited to vaginal bleeding, contractions, leaking of fluid and fetal movement were reviewed in detail with the patient. Please refer to After Visit Summary for other counseling recommendations.   Return in about 2 weeks (around 06/13/2021).  Future Appointments  Date Time Provider Department Center  06/02/2021 11:15 AM [facility] NURSE [facility]  06/02/2021 11:30 AM [facility] US3 [facility]    Scheryl Darter, MD

## 2021-05-30 NOTE — Progress Notes (Signed)
Pt states she is having some vaginal itching.

## 2021-05-31 LAB — CERVICOVAGINAL ANCILLARY ONLY
Bacterial Vaginitis (gardnerella): NEGATIVE
Candida Glabrata: NEGATIVE
Candida Vaginitis: POSITIVE — AB
Comment: NEGATIVE
Comment: NEGATIVE
Comment: NEGATIVE

## 2021-06-02 ENCOUNTER — Other Ambulatory Visit: Payer: Self-pay

## 2021-06-02 ENCOUNTER — Ambulatory Visit: Payer: Medicaid Other | Attending: Obstetrics and Gynecology

## 2021-06-02 ENCOUNTER — Encounter: Payer: Self-pay | Admitting: *Deleted

## 2021-06-02 ENCOUNTER — Ambulatory Visit: Payer: Medicaid Other | Admitting: *Deleted

## 2021-06-02 ENCOUNTER — Other Ambulatory Visit: Payer: Self-pay | Admitting: Obstetrics and Gynecology

## 2021-06-02 VITALS — BP 132/73 | HR 108

## 2021-06-02 DIAGNOSIS — O3432 Maternal care for cervical incompetence, second trimester: Secondary | ICD-10-CM | POA: Insufficient documentation

## 2021-06-02 DIAGNOSIS — Z3480 Encounter for supervision of other normal pregnancy, unspecified trimester: Secondary | ICD-10-CM | POA: Diagnosis present

## 2021-06-05 ENCOUNTER — Other Ambulatory Visit: Payer: Self-pay | Admitting: *Deleted

## 2021-06-05 DIAGNOSIS — Z6835 Body mass index (BMI) 35.0-35.9, adult: Secondary | ICD-10-CM

## 2021-06-05 DIAGNOSIS — O09299 Supervision of pregnancy with other poor reproductive or obstetric history, unspecified trimester: Secondary | ICD-10-CM

## 2021-06-13 ENCOUNTER — Telehealth: Payer: Self-pay

## 2021-06-13 ENCOUNTER — Ambulatory Visit (INDEPENDENT_AMBULATORY_CARE_PROVIDER_SITE_OTHER): Payer: Medicaid Other | Admitting: Obstetrics and Gynecology

## 2021-06-13 ENCOUNTER — Other Ambulatory Visit (HOSPITAL_COMMUNITY)
Admission: RE | Admit: 2021-06-13 | Discharge: 2021-06-13 | Disposition: A | Discharge status: Home / Self Care | Payer: Medicaid Other | Source: Ambulatory Visit | Attending: Obstetrics and Gynecology | Admitting: Obstetrics and Gynecology

## 2021-06-13 ENCOUNTER — Other Ambulatory Visit: Payer: Self-pay

## 2021-06-13 VITALS — BP 128/84 | HR 93 | Wt 244.0 lb

## 2021-06-13 DIAGNOSIS — O09299 Supervision of pregnancy with other poor reproductive or obstetric history, unspecified trimester: Secondary | ICD-10-CM

## 2021-06-13 DIAGNOSIS — Z789 Other specified health status: Secondary | ICD-10-CM

## 2021-06-13 DIAGNOSIS — O99210 Obesity complicating pregnancy, unspecified trimester: Secondary | ICD-10-CM

## 2021-06-13 DIAGNOSIS — N898 Other specified noninflammatory disorders of vagina: Secondary | ICD-10-CM | POA: Diagnosis present

## 2021-06-13 DIAGNOSIS — Z3480 Encounter for supervision of other normal pregnancy, unspecified trimester: Secondary | ICD-10-CM

## 2021-06-13 DIAGNOSIS — Z8632 Personal history of gestational diabetes: Secondary | ICD-10-CM

## 2021-06-13 DIAGNOSIS — O021 Missed abortion: Secondary | ICD-10-CM

## 2021-06-13 NOTE — Progress Notes (Signed)
Vaginal discharge

## 2021-06-13 NOTE — Progress Notes (Signed)
   PRENATAL VISIT NOTE  Subjective:  Teghan Philbin Jesus Lelia Jons is a 35 y.o. [phone] at [redacted]w[redacted]d being seen today for ongoing prenatal care.  She is currently monitored for the following issues for this high-risk pregnancy and has IUFD at less than 20 weeks of gestation; Supervision of other normal pregnancy, antepartum; History of gestational diabetes in prior pregnancy, currently pregnant; History of incompetent cervix, currently pregnant; Language barrier affecting health care; Maternal obesity affecting pregnancy, antepartum; and Vaginal itching on their problem list.  Patient doing well with no acute concerns today. She reports no complaints.  Contractions: Not present. Vag. Bleeding: None.  Movement: Present. Denies leaking of fluid.   The following portions of the patient's history were reviewed and updated as appropriate: allergies, current medications, past family history, past medical history, past social history, past surgical history and problem list. Problem list updated.  Objective:   Vitals:   06/13/21 1039  BP: 128/84  Pulse: 93  Weight: 244 lb (110.7 kg)    Fetal Status: Fetal Heart Rate (bpm): 142 Fundal Height: 16 cm Movement: Present     General:  Alert, oriented and cooperative. Patient is in no acute distress.  Skin: Skin is warm and dry. No rash noted.   Cardiovascular: Normal heart rate noted  Respiratory: Normal respiratory effort, no problems with respiration noted  Abdomen: Soft, gravid, appropriate for gestational age.  Pain/Pressure: Present     Pelvic: Cervical exam deferred        Extremities: Normal range of motion.  Edema: None  Mental Status:  Normal mood and affect. Normal behavior. Normal judgment and thought content.   Assessment and Plan:  Pregnancy: [id number] at [redacted]w[redacted]d  1. Language barrier affecting health care Live interpreter present  2. Maternal obesity affecting pregnancy, antepartum   3. History of incompetent cervix, currently  pregnant Discussed in detail possible need for cerclage.  She has had previous 17 week loss and then successful term pregnancy with usage of cerclage.  Pt advised usual care is cerclage with each pregnancy if there has been documented cervical incompetence.  No records in chart or care everywhere.  17 week loss was in [place], [place].  Recent TVUS showed CL of 5 cm.  Pt advised cerclage placement is usually most effective prior to significant cervical shortening.  She is still unsure and will pursue serial cervical lengths  4. History of gestational diabetes in prior pregnancy, currently pregnant Recent A1c was 5.6  5. Supervision of other normal pregnancy, antepartum Continue routine care - AFP, Serum, Open Spina Bifida  6. Vaginal discharge Per pt, nonspecific vaginal itching noted - Cervicovaginal ancillary only  7. Vaginal itching   8. IUFD at less than 20 weeks of gestation   Preterm labor symptoms and general obstetric precautions including but not limited to vaginal bleeding, contractions, leaking of fluid and fetal movement were reviewed in detail with the patient.  Please refer to After Visit Summary for other counseling recommendations.   Return in about 4 weeks (around 07/11/2021) for [facility], in person.   Mariel Aloe, MD Faculty Attending Center for [facility]

## 2021-06-14 LAB — CERVICOVAGINAL ANCILLARY ONLY
Bacterial Vaginitis (gardnerella): NEGATIVE
Candida Glabrata: NEGATIVE
Candida Vaginitis: POSITIVE — AB
Chlamydia: NEGATIVE
Comment: NEGATIVE
Comment: NEGATIVE
Comment: NEGATIVE
Comment: NEGATIVE
Comment: NEGATIVE
Comment: NORMAL
Neisseria Gonorrhea: NEGATIVE
Trichomonas: NEGATIVE

## 2021-06-15 LAB — AFP, SERUM, OPEN SPINA BIFIDA
AFP MoM: 0.98
AFP Value: 27.3 ng/mL
Gest. Age on Collection Date: 16.5 weeks
Maternal Age At EDD: 34.2 yr
OSBR Risk 1 IN: 10000
Test Results:: NEGATIVE
Weight: 244 [lb_av]

## 2021-06-16 ENCOUNTER — Other Ambulatory Visit: Payer: Self-pay | Admitting: *Deleted

## 2021-06-16 ENCOUNTER — Encounter: Payer: Self-pay | Admitting: *Deleted

## 2021-06-16 DIAGNOSIS — B3731 Acute candidiasis of vulva and vagina: Secondary | ICD-10-CM

## 2021-06-16 MED ORDER — FLUCONAZOLE 150 MG PO TABS: 150.0000 mg | ORAL_TABLET | Freq: Once | ORAL | 0 refills | Status: AC

## 2021-06-16 NOTE — Progress Notes (Signed)
TC to patient using Spanish interpreter. Notified of yeast infection and RX. MyChart message with pt education in Spanish sent as well.

## 2021-06-19 ENCOUNTER — Ambulatory Visit: Payer: Medicaid Other | Admitting: *Deleted

## 2021-06-19 ENCOUNTER — Other Ambulatory Visit: Payer: Self-pay

## 2021-06-19 ENCOUNTER — Ambulatory Visit: Payer: Medicaid Other | Attending: Maternal & Fetal Medicine

## 2021-06-19 ENCOUNTER — Encounter: Payer: Self-pay | Admitting: *Deleted

## 2021-06-19 ENCOUNTER — Ambulatory Visit: Payer: Medicaid Other | Attending: Obstetrics | Admitting: Obstetrics

## 2021-06-19 ENCOUNTER — Other Ambulatory Visit: Payer: Self-pay | Admitting: Maternal & Fetal Medicine

## 2021-06-19 VITALS — BP 133/76 | HR 89

## 2021-06-19 DIAGNOSIS — O99210 Obesity complicating pregnancy, unspecified trimester: Secondary | ICD-10-CM | POA: Insufficient documentation

## 2021-06-19 DIAGNOSIS — O09292 Supervision of pregnancy with other poor reproductive or obstetric history, second trimester: Secondary | ICD-10-CM

## 2021-06-19 DIAGNOSIS — Z789 Other specified health status: Secondary | ICD-10-CM | POA: Diagnosis present

## 2021-06-19 DIAGNOSIS — O99212 Obesity complicating pregnancy, second trimester: Secondary | ICD-10-CM

## 2021-06-19 DIAGNOSIS — Z6835 Body mass index (BMI) 35.0-35.9, adult: Secondary | ICD-10-CM

## 2021-06-19 DIAGNOSIS — O09299 Supervision of pregnancy with other poor reproductive or obstetric history, unspecified trimester: Secondary | ICD-10-CM | POA: Diagnosis present

## 2021-06-19 DIAGNOSIS — E669 Obesity, unspecified: Secondary | ICD-10-CM | POA: Diagnosis not present

## 2021-06-19 DIAGNOSIS — Z3A17 17 weeks gestation of pregnancy: Secondary | ICD-10-CM

## 2021-06-19 DIAGNOSIS — Z8632 Personal history of gestational diabetes: Secondary | ICD-10-CM | POA: Diagnosis present

## 2021-06-19 NOTE — Telephone Encounter (Signed)
ENTERED IN ERROR

## 2021-06-20 ENCOUNTER — Other Ambulatory Visit: Payer: Self-pay

## 2021-06-20 NOTE — Progress Notes (Signed)
MFM Note  Katherine Alvarez was seen for a transvaginal cervical length as she had a prior loss at around 17 weeks.  The cause of the 17-week loss remains undetermined.  Her pregnancy history is as follows:   First pregnancy was a uncomplicated full-term delivery.    Her second pregnancy was the loss at 17 weeks.    In her third pregnancy she had a cervical cerclage placed and was also treated with daily vaginal progesterone.  She delivered at full-term.    The patient has declined to have a cerclage placed in her current pregnancy.  On a transvaginal ultrasound performed today, her cervical length measured 4.5 cm long without any signs of funneling.    She was advised that based on the normal cervical length measured today, her risk of a preterm birth is low at this time.    Management options including a prophylactic cerclage placement, weekly 17-P (Makena) injections to decrease her risk of another preterm birth, versus continued expectant management without any further treatment were discussed.    After discussing the risks versus benefits of the weekly 17-P injections, the patient stated that she would like to receive the weekly 17-P (Makena) injections.    We will continue to follow her with serial transvaginal cervical length measurements.  She understands that should progressive cervical shortening be noted during her future exams, that a cerclage may be recommended at that time.    She will return in 2 weeks for a detailed fetal anatomy scan and another cervical length measurement.   All conversations were held with the patient today with the help of a Spanish interpreter.  A total of 20 minutes was spent counseling and coordinating the care for this patient.  Greater than 50% of the time was spent in direct face-to-face contact.  Recommendations:  Start weekly 17 P (Makena) injections  Serial cervical length measurements

## 2021-07-04 ENCOUNTER — Encounter: Payer: Self-pay | Admitting: *Deleted

## 2021-07-04 ENCOUNTER — Other Ambulatory Visit: Payer: Self-pay | Admitting: Maternal & Fetal Medicine

## 2021-07-04 ENCOUNTER — Ambulatory Visit: Payer: Medicaid Other | Admitting: *Deleted

## 2021-07-04 ENCOUNTER — Other Ambulatory Visit: Payer: Self-pay

## 2021-07-04 ENCOUNTER — Other Ambulatory Visit: Payer: Self-pay | Admitting: *Deleted

## 2021-07-04 ENCOUNTER — Ambulatory Visit: Payer: Medicaid Other | Attending: Maternal & Fetal Medicine

## 2021-07-04 VITALS — BP 121/70 | HR 94

## 2021-07-04 DIAGNOSIS — O99210 Obesity complicating pregnancy, unspecified trimester: Secondary | ICD-10-CM | POA: Insufficient documentation

## 2021-07-04 DIAGNOSIS — O09299 Supervision of pregnancy with other poor reproductive or obstetric history, unspecified trimester: Secondary | ICD-10-CM

## 2021-07-04 DIAGNOSIS — E669 Obesity, unspecified: Secondary | ICD-10-CM | POA: Diagnosis not present

## 2021-07-04 DIAGNOSIS — Z789 Other specified health status: Secondary | ICD-10-CM | POA: Insufficient documentation

## 2021-07-04 DIAGNOSIS — Z3A19 19 weeks gestation of pregnancy: Secondary | ICD-10-CM

## 2021-07-04 DIAGNOSIS — O99212 Obesity complicating pregnancy, second trimester: Secondary | ICD-10-CM | POA: Diagnosis not present

## 2021-07-04 DIAGNOSIS — Z6835 Body mass index (BMI) 35.0-35.9, adult: Secondary | ICD-10-CM | POA: Insufficient documentation

## 2021-07-04 DIAGNOSIS — Z8632 Personal history of gestational diabetes: Secondary | ICD-10-CM

## 2021-07-04 DIAGNOSIS — O09292 Supervision of pregnancy with other poor reproductive or obstetric history, second trimester: Secondary | ICD-10-CM

## 2021-07-11 ENCOUNTER — Ambulatory Visit (INDEPENDENT_AMBULATORY_CARE_PROVIDER_SITE_OTHER): Payer: Self-pay | Admitting: Obstetrics & Gynecology

## 2021-07-11 ENCOUNTER — Other Ambulatory Visit: Payer: Self-pay

## 2021-07-11 VITALS — BP 113/73 | HR 92 | Wt 247.0 lb

## 2021-07-11 DIAGNOSIS — Z3480 Encounter for supervision of other normal pregnancy, unspecified trimester: Secondary | ICD-10-CM

## 2021-07-11 DIAGNOSIS — O09299 Supervision of pregnancy with other poor reproductive or obstetric history, unspecified trimester: Secondary | ICD-10-CM

## 2021-07-11 DIAGNOSIS — O99210 Obesity complicating pregnancy, unspecified trimester: Secondary | ICD-10-CM

## 2021-07-11 DIAGNOSIS — Z8751 Personal history of pre-term labor: Secondary | ICD-10-CM

## 2021-07-11 LAB — POCT URINE QUALITATIVE DIPSTICK BLOOD: Blood, UA: NEGATIVE

## 2021-07-11 MED ORDER — HYDROXYPROGESTERONE CAPROATE 275 MG/1.1ML ~~LOC~~ SOAJ
275.0000 mg | Freq: Once | SUBCUTANEOUS | Status: AC
Start: 2021-07-11 — End: 2021-07-11
  Administered 2021-07-11: 275 mg via SUBCUTANEOUS

## 2021-07-11 NOTE — Progress Notes (Signed)
   PRENATAL VISIT NOTE  Subjective:  Katherine Alvarez is a 35 y.o. [phone] at [redacted]w[redacted]d being seen today for ongoing prenatal care.  She is currently monitored for the following issues for this high-risk pregnancy and has IUFD at less than 20 weeks of gestation; Supervision of other normal pregnancy, antepartum; History of gestational diabetes in prior pregnancy, currently pregnant; History of incompetent cervix, currently pregnant; Language barrier affecting health care; Maternal obesity affecting pregnancy, antepartum; and Vaginal itching on their problem list.  Patient reports no complaints.  Contractions: Not present. Vag. Bleeding: None.  Movement: Present. Denies leaking of fluid.   The following portions of the patient's history were reviewed and updated as appropriate: allergies, current medications, past family history, past medical history, past social history, past surgical history and problem list.   Objective:   Vitals:   07/11/21 1045  BP: 113/73  Pulse: 92  Weight: 247 lb (112 kg)    Fetal Status: Fetal Heart Rate (bpm): 152   Movement: Present     General:  Alert, oriented and cooperative. Patient is in no acute distress.  Skin: Skin is warm and dry. No rash noted.   Cardiovascular: Normal heart rate noted  Respiratory: Normal respiratory effort, no problems with respiration noted  Abdomen: Soft, gravid, appropriate for gestational age.  Pain/Pressure: Absent     Pelvic: Cervical exam deferred        Extremities: Normal range of motion.  Edema: None  Mental Status: Normal mood and affect. Normal behavior. Normal judgment and thought content.   Assessment and Plan:  Pregnancy: [id number] at [redacted]w[redacted]d 1. History of incompetent cervix, currently pregnant Start treatment - HYDROXYprogesterone caproate (Makena) autoinjector 275 mg  2. Supervision of other normal pregnancy, antepartum F/u US next week  3. Maternal obesity affecting pregnancy, antepartum Body mass  index is 38.98 kg/m.   Preterm labor symptoms and general obstetric precautions including but not limited to vaginal bleeding, contractions, leaking of fluid and fetal movement were reviewed in detail with the patient. Please refer to After Visit Summary for other counseling recommendations.   No follow-ups on file.  Future Appointments  Date Time Provider Department Center  07/18/2021  9:45 AM [facility] NURSE [facility]  07/18/2021 10:00 AM [facility] US1 [facility]  08/02/2021 10:45 AM [facility] NURSE [facility]  08/02/2021 11:00 AM [facility]    Scheryl Darter, MD

## 2021-07-18 ENCOUNTER — Ambulatory Visit (INDEPENDENT_AMBULATORY_CARE_PROVIDER_SITE_OTHER): Payer: Medicaid Other

## 2021-07-18 ENCOUNTER — Other Ambulatory Visit: Payer: Self-pay

## 2021-07-18 ENCOUNTER — Ambulatory Visit: Payer: Medicaid Other | Admitting: *Deleted

## 2021-07-18 ENCOUNTER — Ambulatory Visit: Payer: Medicaid Other | Attending: Obstetrics and Gynecology

## 2021-07-18 ENCOUNTER — Other Ambulatory Visit: Payer: Self-pay | Admitting: Obstetrics and Gynecology

## 2021-07-18 VITALS — BP 143/71 | HR 102

## 2021-07-18 DIAGNOSIS — Z3A21 21 weeks gestation of pregnancy: Secondary | ICD-10-CM | POA: Diagnosis not present

## 2021-07-18 DIAGNOSIS — O09299 Supervision of pregnancy with other poor reproductive or obstetric history, unspecified trimester: Secondary | ICD-10-CM | POA: Insufficient documentation

## 2021-07-18 DIAGNOSIS — Z8759 Personal history of other complications of pregnancy, childbirth and the puerperium: Secondary | ICD-10-CM | POA: Diagnosis not present

## 2021-07-18 DIAGNOSIS — O99210 Obesity complicating pregnancy, unspecified trimester: Secondary | ICD-10-CM | POA: Diagnosis present

## 2021-07-18 DIAGNOSIS — Z8632 Personal history of gestational diabetes: Secondary | ICD-10-CM

## 2021-07-18 DIAGNOSIS — O99212 Obesity complicating pregnancy, second trimester: Secondary | ICD-10-CM

## 2021-07-18 DIAGNOSIS — Z3A22 22 weeks gestation of pregnancy: Secondary | ICD-10-CM | POA: Diagnosis not present

## 2021-07-18 DIAGNOSIS — Z789 Other specified health status: Secondary | ICD-10-CM | POA: Insufficient documentation

## 2021-07-18 DIAGNOSIS — O09292 Supervision of pregnancy with other poor reproductive or obstetric history, second trimester: Secondary | ICD-10-CM

## 2021-07-18 DIAGNOSIS — E669 Obesity, unspecified: Secondary | ICD-10-CM

## 2021-07-18 DIAGNOSIS — O021 Missed abortion: Secondary | ICD-10-CM

## 2021-07-18 MED ORDER — HYDROXYPROGESTERONE CAPROATE 275 MG/1.1ML ~~LOC~~ SOAJ
275.0000 mg | Freq: Once | SUBCUTANEOUS | Status: AC
  Administered 2021-07-18: 275 mg via SUBCUTANEOUS

## 2021-07-18 NOTE — Progress Notes (Signed)
Patient presents for 17p injection. Injection given in left arm. Patient tolerated well.

## 2021-07-18 NOTE — Progress Notes (Signed)
Agree with nurses's documentation of this patient's clinic encounter.  Kirrah Mustin L, MD  

## 2021-07-25 ENCOUNTER — Ambulatory Visit (INDEPENDENT_AMBULATORY_CARE_PROVIDER_SITE_OTHER): Payer: Medicaid Other

## 2021-07-25 ENCOUNTER — Other Ambulatory Visit: Payer: Self-pay

## 2021-07-25 VITALS — BP 104/69 | HR 93 | Wt 244.0 lb

## 2021-07-25 DIAGNOSIS — O021 Missed abortion: Secondary | ICD-10-CM

## 2021-07-25 DIAGNOSIS — Z8751 Personal history of pre-term labor: Secondary | ICD-10-CM | POA: Diagnosis not present

## 2021-07-25 DIAGNOSIS — O09299 Supervision of pregnancy with other poor reproductive or obstetric history, unspecified trimester: Secondary | ICD-10-CM

## 2021-07-25 MED ORDER — HYDROXYPROGESTERONE CAPROATE 275 MG/1.1ML ~~LOC~~ SOAJ
275.0000 mg | Freq: Once | SUBCUTANEOUS | Status: AC
  Administered 2021-07-25: 275 mg via SUBCUTANEOUS

## 2021-07-25 NOTE — Progress Notes (Signed)
Patient presents for 17p injection. Injection given in right arm. Patient tolerated well. No adverse side effects noted. Pt to return in 1 week for repeat injection.

## 2021-08-01 ENCOUNTER — Other Ambulatory Visit: Payer: Self-pay

## 2021-08-01 ENCOUNTER — Ambulatory Visit (INDEPENDENT_AMBULATORY_CARE_PROVIDER_SITE_OTHER): Payer: Medicaid Other

## 2021-08-01 DIAGNOSIS — O021 Missed abortion: Secondary | ICD-10-CM

## 2021-08-01 DIAGNOSIS — Z8751 Personal history of pre-term labor: Secondary | ICD-10-CM

## 2021-08-01 DIAGNOSIS — O09299 Supervision of pregnancy with other poor reproductive or obstetric history, unspecified trimester: Secondary | ICD-10-CM

## 2021-08-01 MED ORDER — HYDROXYPROGESTERONE CAPROATE 275 MG/1.1ML ~~LOC~~ SOAJ
275.0000 mg | Freq: Once | SUBCUTANEOUS | Status: AC
  Administered 2021-08-01: 275 mg via SUBCUTANEOUS

## 2021-08-01 NOTE — Progress Notes (Signed)
Patient was assessed and managed by nursing staff during this encounter. I have reviewed the chart and agree with the documentation and plan. I have also made any necessary editorial changes.  Catalina Antigua, MD 08/01/2021 8:05 PM

## 2021-08-01 NOTE — Progress Notes (Signed)
Patient presents for her 17p injection. Injection given in Left arm. Patient tolerated well.

## 2021-08-02 ENCOUNTER — Ambulatory Visit: Payer: Medicaid Other | Admitting: *Deleted

## 2021-08-02 ENCOUNTER — Other Ambulatory Visit: Payer: Self-pay | Admitting: Obstetrics

## 2021-08-02 ENCOUNTER — Other Ambulatory Visit: Payer: Self-pay | Admitting: *Deleted

## 2021-08-02 ENCOUNTER — Encounter: Payer: Self-pay | Admitting: *Deleted

## 2021-08-02 ENCOUNTER — Ambulatory Visit: Payer: Medicaid Other | Attending: Obstetrics and Gynecology

## 2021-08-02 VITALS — BP 124/72 | HR 102

## 2021-08-02 DIAGNOSIS — E669 Obesity, unspecified: Secondary | ICD-10-CM

## 2021-08-02 DIAGNOSIS — O3432 Maternal care for cervical incompetence, second trimester: Secondary | ICD-10-CM | POA: Diagnosis present

## 2021-08-02 DIAGNOSIS — Z3A23 23 weeks gestation of pregnancy: Secondary | ICD-10-CM | POA: Diagnosis present

## 2021-08-02 DIAGNOSIS — Z9889 Other specified postprocedural states: Secondary | ICD-10-CM | POA: Insufficient documentation

## 2021-08-02 DIAGNOSIS — O09299 Supervision of pregnancy with other poor reproductive or obstetric history, unspecified trimester: Secondary | ICD-10-CM | POA: Insufficient documentation

## 2021-08-02 DIAGNOSIS — O09292 Supervision of pregnancy with other poor reproductive or obstetric history, second trimester: Secondary | ICD-10-CM | POA: Diagnosis present

## 2021-08-02 DIAGNOSIS — O99210 Obesity complicating pregnancy, unspecified trimester: Secondary | ICD-10-CM | POA: Diagnosis present

## 2021-08-02 DIAGNOSIS — O99212 Obesity complicating pregnancy, second trimester: Secondary | ICD-10-CM

## 2021-08-02 DIAGNOSIS — Z8632 Personal history of gestational diabetes: Secondary | ICD-10-CM

## 2021-08-02 DIAGNOSIS — Z789 Other specified health status: Secondary | ICD-10-CM | POA: Insufficient documentation

## 2021-08-02 DIAGNOSIS — Z8759 Personal history of other complications of pregnancy, childbirth and the puerperium: Secondary | ICD-10-CM

## 2021-08-03 ENCOUNTER — Other Ambulatory Visit: Payer: Self-pay | Admitting: *Deleted

## 2021-08-03 DIAGNOSIS — O09292 Supervision of pregnancy with other poor reproductive or obstetric history, second trimester: Secondary | ICD-10-CM

## 2021-08-03 DIAGNOSIS — Z8759 Personal history of other complications of pregnancy, childbirth and the puerperium: Secondary | ICD-10-CM

## 2021-08-08 ENCOUNTER — Other Ambulatory Visit: Payer: Self-pay

## 2021-08-08 ENCOUNTER — Ambulatory Visit (INDEPENDENT_AMBULATORY_CARE_PROVIDER_SITE_OTHER): Payer: Medicaid Other | Admitting: Obstetrics & Gynecology

## 2021-08-08 VITALS — BP 109/70 | HR 91 | Wt 253.0 lb

## 2021-08-08 DIAGNOSIS — Z3A24 24 weeks gestation of pregnancy: Secondary | ICD-10-CM | POA: Diagnosis not present

## 2021-08-08 DIAGNOSIS — Z8632 Personal history of gestational diabetes: Secondary | ICD-10-CM

## 2021-08-08 DIAGNOSIS — O09299 Supervision of pregnancy with other poor reproductive or obstetric history, unspecified trimester: Secondary | ICD-10-CM

## 2021-08-08 DIAGNOSIS — Z8751 Personal history of pre-term labor: Secondary | ICD-10-CM

## 2021-08-08 DIAGNOSIS — O09292 Supervision of pregnancy with other poor reproductive or obstetric history, second trimester: Secondary | ICD-10-CM

## 2021-08-08 DIAGNOSIS — Z3480 Encounter for supervision of other normal pregnancy, unspecified trimester: Secondary | ICD-10-CM

## 2021-08-08 MED ORDER — HYDROXYPROGESTERONE CAPROATE 275 MG/1.1ML ~~LOC~~ SOAJ
275.0000 mg | Freq: Once | SUBCUTANEOUS | Status: AC
  Administered 2021-08-08: 275 mg via SUBCUTANEOUS

## 2021-08-08 NOTE — Progress Notes (Signed)
+   Fetal movement. Pt needs RF on BASA.   Makena given subQ right arm. Pt to return 1 week for repeat injection.

## 2021-08-08 NOTE — Progress Notes (Signed)
   PRENATAL VISIT NOTE  Subjective:  Katherine Alvarez is a 35 y.o. [phone] at [redacted]w[redacted]d being seen today for ongoing prenatal care.  She is currently monitored for the following issues for this high-risk pregnancy and has IUFD at less than 20 weeks of gestation; Supervision of other normal pregnancy, antepartum; History of gestational diabetes in prior pregnancy, currently pregnant; History of incompetent cervix, currently pregnant; Language barrier affecting health care; Maternal obesity affecting pregnancy, antepartum; and Vaginal itching on their problem list.  Patient reports no complaints.  Contractions: Not present. Vag. Bleeding: None.  Movement: Present. Denies leaking of fluid.   The following portions of the patient's history were reviewed and updated as appropriate: allergies, current medications, past family history, past medical history, past social history, past surgical history and problem list.   Objective:   Vitals:   08/08/21 0836 08/08/21 0838  BP:  109/70  Pulse:  91  Weight: 253 lb 11.2 oz (115.1 kg) 253 lb (114.8 kg)    Fetal Status:     Movement: Present     General:  Alert, oriented and cooperative. Patient is in no acute distress.  Skin: Skin is warm and dry. No rash noted.   Cardiovascular: Normal heart rate noted  Respiratory: Normal respiratory effort, no problems with respiration noted  Abdomen: Soft, gravid, appropriate for gestational age.  Pain/Pressure: Absent     Pelvic: Cervical exam deferred        Extremities: Normal range of motion.  Edema: None  Mental Status: Normal mood and affect. Normal behavior. Normal judgment and thought content.   Assessment and Plan:  Pregnancy: [id number] at [redacted]w[redacted]d 1. History of gestational diabetes in prior pregnancy, currently pregnant Needs 2 hr at 28 weeks  2. Supervision of other normal pregnancy, antepartum Given today - HYDROXYprogesterone caproate (Makena) autoinjector 275 mg  3. History of  incompetent cervix, currently pregnant [place] last week normal CL  Preterm labor symptoms and general obstetric precautions including but not limited to vaginal bleeding, contractions, leaking of fluid and fetal movement were reviewed in detail with the patient. Please refer to After Visit Summary for other counseling recommendations.   Return in about 3 weeks (around 08/29/2021).  Future Appointments  Date Time Provider Department Center  08/28/2021  3:30 PM [facility] NURSE [facility]  08/28/2021  3:45 PM [facility] US4 [facility]  10/10/2021  9:00 AM [facility]SE [facility]  10/10/2021  9:15 AM [facility]2 [facility]    Scheryl Darter, MD

## 2021-08-15 ENCOUNTER — Other Ambulatory Visit: Payer: Self-pay

## 2021-08-15 ENCOUNTER — Ambulatory Visit (INDEPENDENT_AMBULATORY_CARE_PROVIDER_SITE_OTHER): Payer: Medicaid Other | Admitting: *Deleted

## 2021-08-15 DIAGNOSIS — Z3480 Encounter for supervision of other normal pregnancy, unspecified trimester: Secondary | ICD-10-CM

## 2021-08-15 DIAGNOSIS — Z3A25 25 weeks gestation of pregnancy: Secondary | ICD-10-CM

## 2021-08-15 DIAGNOSIS — Z8751 Personal history of pre-term labor: Secondary | ICD-10-CM

## 2021-08-15 MED ORDER — HYDROXYPROGESTERONE CAPROATE 275 MG/1.1ML ~~LOC~~ SOAJ
275.0000 mg | Freq: Once | SUBCUTANEOUS | Status: AC
  Administered 2021-08-15: 275 mg via SUBCUTANEOUS

## 2021-08-15 NOTE — Addendum Note (Signed)
Addended by: Harrel Lemon on: 08/15/2021 04:20 PM   Modules accepted: Orders

## 2021-08-15 NOTE — Progress Notes (Signed)
Patient presents for her 17p injection. Injection given in Left arm. Patient tolerated well.

## 2021-08-22 ENCOUNTER — Other Ambulatory Visit: Payer: Self-pay

## 2021-08-22 ENCOUNTER — Ambulatory Visit (INDEPENDENT_AMBULATORY_CARE_PROVIDER_SITE_OTHER): Payer: Medicaid Other

## 2021-08-22 VITALS — BP 124/75 | HR 80

## 2021-08-22 DIAGNOSIS — Z8751 Personal history of pre-term labor: Secondary | ICD-10-CM | POA: Diagnosis not present

## 2021-08-22 DIAGNOSIS — O021 Missed abortion: Secondary | ICD-10-CM

## 2021-08-22 DIAGNOSIS — Z3480 Encounter for supervision of other normal pregnancy, unspecified trimester: Secondary | ICD-10-CM

## 2021-08-22 MED ORDER — HYDROXYPROGESTERONE CAPROATE 275 MG/1.1ML ~~LOC~~ SOAJ
275.0000 mg | Freq: Once | SUBCUTANEOUS | Status: AC
Start: 2021-08-22 — End: 2021-08-22
  Administered 2021-08-22: 275 mg via SUBCUTANEOUS

## 2021-08-22 NOTE — Progress Notes (Signed)
Patient was assessed and managed by nursing staff during this encounter. I have reviewed the chart and agree with the documentation and plan. I have also made any necessary editorial changes.  Catalina Antigua, MD 08/22/2021 5:03 PM

## 2021-08-22 NOTE — Progress Notes (Signed)
Pt here for 17p. Denies any issues or concerns at this time. Pt tolerated injection well.   

## 2021-08-28 ENCOUNTER — Other Ambulatory Visit: Payer: Self-pay

## 2021-08-28 ENCOUNTER — Ambulatory Visit: Payer: Medicaid Other | Admitting: *Deleted

## 2021-08-28 ENCOUNTER — Ambulatory Visit: Payer: Medicaid Other | Attending: Maternal & Fetal Medicine

## 2021-08-28 VITALS — BP 121/66 | HR 95

## 2021-08-28 DIAGNOSIS — O99210 Obesity complicating pregnancy, unspecified trimester: Secondary | ICD-10-CM | POA: Diagnosis present

## 2021-08-28 DIAGNOSIS — Z362 Encounter for other antenatal screening follow-up: Secondary | ICD-10-CM

## 2021-08-28 DIAGNOSIS — O09292 Supervision of pregnancy with other poor reproductive or obstetric history, second trimester: Secondary | ICD-10-CM | POA: Diagnosis present

## 2021-08-28 DIAGNOSIS — Z3A27 27 weeks gestation of pregnancy: Secondary | ICD-10-CM

## 2021-08-28 DIAGNOSIS — Z8759 Personal history of other complications of pregnancy, childbirth and the puerperium: Secondary | ICD-10-CM

## 2021-08-28 DIAGNOSIS — E669 Obesity, unspecified: Secondary | ICD-10-CM

## 2021-08-28 DIAGNOSIS — O99212 Obesity complicating pregnancy, second trimester: Secondary | ICD-10-CM | POA: Diagnosis not present

## 2021-08-28 DIAGNOSIS — Z789 Other specified health status: Secondary | ICD-10-CM | POA: Insufficient documentation

## 2021-08-28 DIAGNOSIS — O09299 Supervision of pregnancy with other poor reproductive or obstetric history, unspecified trimester: Secondary | ICD-10-CM

## 2021-08-28 DIAGNOSIS — Z8632 Personal history of gestational diabetes: Secondary | ICD-10-CM

## 2021-08-29 ENCOUNTER — Ambulatory Visit (INDEPENDENT_AMBULATORY_CARE_PROVIDER_SITE_OTHER): Payer: Medicaid Other

## 2021-08-29 ENCOUNTER — Other Ambulatory Visit: Payer: Self-pay | Admitting: *Deleted

## 2021-08-29 VITALS — BP 107/70 | HR 81 | Ht 65.0 in | Wt 249.0 lb

## 2021-08-29 DIAGNOSIS — Z8751 Personal history of pre-term labor: Secondary | ICD-10-CM

## 2021-08-29 DIAGNOSIS — Z3A27 27 weeks gestation of pregnancy: Secondary | ICD-10-CM

## 2021-08-29 DIAGNOSIS — Z362 Encounter for other antenatal screening follow-up: Secondary | ICD-10-CM

## 2021-08-29 DIAGNOSIS — O021 Missed abortion: Secondary | ICD-10-CM

## 2021-08-29 DIAGNOSIS — O09299 Supervision of pregnancy with other poor reproductive or obstetric history, unspecified trimester: Secondary | ICD-10-CM

## 2021-08-29 DIAGNOSIS — Z3480 Encounter for supervision of other normal pregnancy, unspecified trimester: Secondary | ICD-10-CM

## 2021-08-29 MED ORDER — PRENATE MINI 18-0.6-0.4-350 MG PO CAPS: 1.0000 | ORAL_CAPSULE | Freq: Every day | ORAL | 12 refills | Status: DC

## 2021-08-29 MED ORDER — HYDROXYPROGESTERONE CAPROATE 275 MG/1.1ML ~~LOC~~ SOAJ
275.0000 mg | Freq: Once | SUBCUTANEOUS | Status: AC
  Administered 2021-08-29: 275 mg via SUBCUTANEOUS

## 2021-08-29 NOTE — Progress Notes (Signed)
Patient presents for 17p injection. Injection given in right arm. Patient tolerated well. No adverse side effects noted. Pt to return in 1 week for repeat injection.  

## 2021-09-01 ENCOUNTER — Ambulatory Visit (INDEPENDENT_AMBULATORY_CARE_PROVIDER_SITE_OTHER): Payer: Medicaid Other | Admitting: Obstetrics & Gynecology

## 2021-09-01 ENCOUNTER — Other Ambulatory Visit: Payer: Self-pay

## 2021-09-01 VITALS — BP 102/69 | HR 93 | Wt 251.3 lb

## 2021-09-01 DIAGNOSIS — Z23 Encounter for immunization: Secondary | ICD-10-CM | POA: Diagnosis not present

## 2021-09-01 DIAGNOSIS — Z3480 Encounter for supervision of other normal pregnancy, unspecified trimester: Secondary | ICD-10-CM

## 2021-09-01 DIAGNOSIS — O09299 Supervision of pregnancy with other poor reproductive or obstetric history, unspecified trimester: Secondary | ICD-10-CM

## 2021-09-01 DIAGNOSIS — Z8632 Personal history of gestational diabetes: Secondary | ICD-10-CM

## 2021-09-01 DIAGNOSIS — Z3A28 28 weeks gestation of pregnancy: Secondary | ICD-10-CM

## 2021-09-01 NOTE — Progress Notes (Signed)
ROB 28.[redacted] wks GA Was not scheduled for GTT, will schedule now as we have adequate time. GTT, CBC, HIV, RPR today  Anxiety and depression screen negative. TDAP offered and accepted.

## 2021-09-01 NOTE — Progress Notes (Signed)
° °  PRENATAL VISIT NOTE  Subjective:  Katherine Alvarez is a 35 y.o. [phone] at [redacted]w[redacted]d being seen today for ongoing prenatal care.  She is currently monitored for the following issues for this high-risk pregnancy and has IUFD at less than 20 weeks of gestation; Supervision of other normal pregnancy, antepartum; History of gestational diabetes in prior pregnancy, currently pregnant; History of incompetent cervix, currently pregnant; Language barrier affecting health care; Maternal obesity affecting pregnancy, antepartum; and Vaginal itching on their problem list.  Patient reports no complaints.  Contractions: Not present. Vag. Bleeding: None.  Movement: Present. Denies leaking of fluid.   The following portions of the patient's history were reviewed and updated as appropriate: allergies, current medications, past family history, past medical history, past social history, past surgical history and problem list.   Objective:   Vitals:   09/01/21 0845  BP: 102/69  Pulse: 93  Weight: 251 lb 4.8 oz (114 kg)    Fetal Status:   Fundal Height: 28 cm Movement: Present     General:  Alert, oriented and cooperative. Patient is in no acute distress.  Skin: Skin is warm and dry. No rash noted.   Cardiovascular: Normal heart rate noted  Respiratory: Normal respiratory effort, no problems with respiration noted  Abdomen: Soft, gravid, appropriate for gestational age.  Pain/Pressure: Present     Pelvic: Cervical exam deferred        Extremities: Normal range of motion.  Edema: None  Mental Status: Normal mood and affect. Normal behavior. Normal judgment and thought content.   Assessment and Plan:  Pregnancy: [id number] at [redacted]w[redacted]d 1. Supervision of other normal pregnancy, antepartum 28 week labs - Glucose Tolerance, 2 Hours w/1 Hour - CBC - RPR - HIV Antibody (routine testing w rflx)  2. History of gestational diabetes in prior pregnancy, currently pregnant GTT today  3. History of  incompetent cervix, currently pregnant   Preterm labor symptoms and general obstetric precautions including but not limited to vaginal bleeding, contractions, leaking of fluid and fetal movement were reviewed in detail with the patient. Please refer to After Visit Summary for other counseling recommendations.   Return in about 2 weeks (around 09/15/2021).  Future Appointments  Date Time Provider Department Center  09/05/2021  3:00 PM [facility]URSE [facility]  09/26/2021  3:00 PM [facility]  09/26/2021  3:15 PM [facility]2 [facility]  10/10/2021  9:00 AM [facility] NURSE [facility]  10/10/2021  9:15 AM [facility]2 [facility]    Scheryl Darter, MD

## 2021-09-02 LAB — GLUCOSE TOLERANCE, 2 HOURS W/ 1HR
Glucose, 1 hour: 185 mg/dL — ABNORMAL HIGH (ref 70–179)
Glucose, 2 hour: 136 mg/dL (ref 70–152)
Glucose, Fasting: 88 mg/dL (ref 70–91)

## 2021-09-02 LAB — HIV ANTIBODY (ROUTINE TESTING W REFLEX): HIV Screen 4th Generation wRfx: NONREACTIVE

## 2021-09-02 LAB — CBC
Hematocrit: 37.9 % (ref 34.0–46.6)
Hemoglobin: 12.2 g/dL (ref 11.1–15.9)
MCH: 29.2 pg (ref 26.6–33.0)
MCHC: 32.2 g/dL (ref 31.5–35.7)
MCV: 91 fL (ref 79–97)
Platelets: 222 10*3/uL (ref 150–450)
RBC: 4.18 x10E6/uL (ref 3.77–5.28)
RDW: 13.1 % (ref 11.7–15.4)
WBC: 11.8 10*3/uL — ABNORMAL HIGH (ref 3.4–10.8)

## 2021-09-02 LAB — RPR: RPR Ser Ql: NONREACTIVE

## 2021-09-05 ENCOUNTER — Ambulatory Visit (INDEPENDENT_AMBULATORY_CARE_PROVIDER_SITE_OTHER): Payer: Medicaid Other

## 2021-09-05 ENCOUNTER — Other Ambulatory Visit: Payer: Self-pay

## 2021-09-05 VITALS — BP 110/71 | HR 92 | Ht 65.0 in | Wt 250.0 lb

## 2021-09-05 DIAGNOSIS — O021 Missed abortion: Secondary | ICD-10-CM

## 2021-09-05 DIAGNOSIS — O09299 Supervision of pregnancy with other poor reproductive or obstetric history, unspecified trimester: Secondary | ICD-10-CM

## 2021-09-05 DIAGNOSIS — O24419 Gestational diabetes mellitus in pregnancy, unspecified control: Secondary | ICD-10-CM | POA: Diagnosis not present

## 2021-09-05 MED ORDER — HYDROXYPROGESTERONE CAPROATE 275 MG/1.1ML ~~LOC~~ SOAJ
275.0000 mg | Freq: Once | SUBCUTANEOUS | Status: AC
Start: 2021-09-05 — End: 2021-09-05
  Administered 2021-09-05: 15:00:00 275 mg via SUBCUTANEOUS

## 2021-09-05 MED ORDER — ACCU-CHEK GUIDE W/DEVICE KIT
1.0000 | PACK | Freq: Four times a day (QID) | 0 refills | Status: DC
Start: 2021-09-05 — End: 2021-11-18

## 2021-09-05 MED ORDER — ACCU-CHEK SOFTCLIX LANCETS MISC
12 refills | Status: DC
Start: 2021-09-05 — End: 2021-11-18

## 2021-09-05 MED ORDER — ACCU-CHEK GUIDE VI STRP
ORAL_STRIP | 12 refills | Status: DC
Start: 2021-09-05 — End: 2021-11-18

## 2021-09-05 NOTE — Progress Notes (Signed)
Patient presents for 17p injection. Injection given in left arm. Patient tolerated well. No adverse side effects noted. Pt to return in 1 week for repeat injection.  While patient was here she asked about 2 hour glucola results. Advised patient labs were abnormal and she would now be considered gestational throughout the rest of the pregnancy. Advised patient would go ahead and send in supplies for the GDM and referral to nutrition management. Pt agreed and verbalized understanding.

## 2021-09-12 ENCOUNTER — Ambulatory Visit (INDEPENDENT_AMBULATORY_CARE_PROVIDER_SITE_OTHER): Payer: Medicaid Other | Admitting: *Deleted

## 2021-09-12 ENCOUNTER — Other Ambulatory Visit: Payer: Self-pay

## 2021-09-12 VITALS — BP 111/74 | HR 98

## 2021-09-12 DIAGNOSIS — Z3A29 29 weeks gestation of pregnancy: Secondary | ICD-10-CM

## 2021-09-12 DIAGNOSIS — Z8751 Personal history of pre-term labor: Secondary | ICD-10-CM

## 2021-09-12 DIAGNOSIS — O09299 Supervision of pregnancy with other poor reproductive or obstetric history, unspecified trimester: Secondary | ICD-10-CM

## 2021-09-12 MED ORDER — HYDROXYPROGESTERONE CAPROATE 275 MG/1.1ML ~~LOC~~ SOAJ
275.0000 mg | Freq: Once | SUBCUTANEOUS | Status: AC
Start: 2021-09-12 — End: 2021-09-12
  Administered 2021-09-12: 15:00:00 275 mg via SUBCUTANEOUS

## 2021-09-12 NOTE — Progress Notes (Signed)
Agree with nurses's documentation of this patient's clinic encounter.  Tykia Mellone L, MD  

## 2021-09-12 NOTE — Progress Notes (Signed)
Pt is in office for 17p injection.  Injection given in R arm, tolerated well. Pt advised to return next week for OB/17p visit.  Pt also has nutrition class this week.   BP 111/74    Pulse 98    LMP 02/08/2021 (Approximate)   Pt states one episode of spotting when in bathroom at home.  Pt denies any since. Pt denies any recent intercourse or urinary complaints.  Pt advised to monitor at home, if bleeding continues or becomes constant she needs to be seen.    Administrations This Visit     HYDROXYprogesterone caproate (Makena) autoinjector 275 mg     Admin Date 09/12/2021 Action Given Dose 275 mg Route Subcutaneous Administered By Lanney Gins, CMA

## 2021-09-14 ENCOUNTER — Ambulatory Visit: Payer: Medicaid Other | Admitting: Registered"

## 2021-09-14 ENCOUNTER — Other Ambulatory Visit: Payer: Self-pay

## 2021-09-14 ENCOUNTER — Encounter: Payer: Medicaid Other | Attending: Obstetrics & Gynecology | Admitting: Registered"

## 2021-09-14 DIAGNOSIS — O24419 Gestational diabetes mellitus in pregnancy, unspecified control: Secondary | ICD-10-CM | POA: Insufficient documentation

## 2021-09-14 DIAGNOSIS — Z3A00 Weeks of gestation of pregnancy not specified: Secondary | ICD-10-CM | POA: Insufficient documentation

## 2021-09-14 NOTE — Progress Notes (Signed)
[facility] Spanish interpreter [facility]by Fam #[id number]  Patient was seen for Gestational Diabetes self-management on 09/14/21  Start time 1315 and End time 1413   Estimated due date: 11/23/21; [redacted]w[redacted]d  Clinical: Medications: reviewed Medical History: GDM, insulin controled 2 yrs ago Labs: OGTT 1 hr 185, A1c 5.6%   Dietary and Lifestyle History: Pt states she stopped drinking sweet beverages. Pt states she eats fish often. Pt prepares meat baking, was fying more before diagnosis.  Physical Activity: ADLs Stress: not assessed Sleep: not assessed  24 hr Recall:  First Meal: egg, whole wheat bread, milk, sometimes fruit Snack: Second meal: bread, egg, orange Snack: nuts Third meal: fish filet baked, tomato, vegetables, rice 1 c, 2 bottles water Snack: melon, cucumber or strawberries Beverages: water, milk  NUTRITION INTERVENTION  Nutrition education (E-1) on the following topics:   Initial Follow-up  [x]  []  Definition of Gestational Diabetes []  []  Why dietary management is important in controlling blood glucose [x]  []  Effects each nutrient has on blood glucose levels []  []  Simple carbohydrates vs complex carbohydrates []  []  Fluid intake [x]  []  Creating a balanced meal plan [x]  []  Carbohydrate counting  [x]  []  When to check blood glucose levels [x]  []  Proper blood glucose monitoring techniques [x]  []  Effect of stress and stress reduction techniques  [x]  []  Exercise effect on blood glucose levels, appropriate exercise during pregnancy []  []  Importance of limiting caffeine and abstaining from alcohol and smoking [x]  []  Medications used for blood sugar control during pregnancy []  []  Hypoglycemia and rule of 15 [x]  []  Postpartum self care  Blood glucose monitor given: Accu-chek Guide Me Lot Exp: 10/27/2022 CBG: 129 mg/dL 1 hr after lunch described above  Patient instructed to monitor glucose levels: FBS: 60 - ? 95 mg/dL (some clinics use 90 for cutoff) 1 hour: ? 140 mg/dL 2  hour: ? mg/dL  Patient received handouts: Nutrition Diabetes and Pregnancy Carbohydrate Counting List  Patient will be seen for follow-up as needed.

## 2021-09-17 NOTE — L&D Delivery Note (Addendum)
Delivery Note ?Received call from bedside nurse at 8:28 pm regarding inability to obtain a continuous fetal heart tracing. Received call from nurse at 8:30 pm reporting baby had delivered. At 8:30 PM a viable female was delivered via Vaginal, Spontaneous (Presentation: Left Occiput Anterior) by RN. APGAR: 9, 9; weight  pending. Arrived to room at 8:34 pm. 20 units pitocin diluted in 500 cc LR rapidly infused. Placenta separated spontaneously and delivered via CCT, schultz.  ?Cord: 3 vessels with the following complications: None.  ? ?Anesthesia: Epidural ?Episiotomy: None ?Lacerations: None ?Suture Repair:  intact ?Est. Blood Loss (mL): 238 ? ?Mom to postpartum.  Baby to Couplet care / Skin to Skin. ? ?Hardie Shackleton, SNM ?11/16/2021, 8:58 PM ? ? ?The above was performed under my direct supervision and guidance.  ? ? ? ?

## 2021-09-19 ENCOUNTER — Other Ambulatory Visit: Payer: Self-pay

## 2021-09-19 ENCOUNTER — Ambulatory Visit (INDEPENDENT_AMBULATORY_CARE_PROVIDER_SITE_OTHER): Payer: Medicaid Other | Admitting: Family Medicine

## 2021-09-19 VITALS — BP 100/68 | HR 102 | Wt 251.0 lb

## 2021-09-19 DIAGNOSIS — Z3A30 30 weeks gestation of pregnancy: Secondary | ICD-10-CM | POA: Diagnosis not present

## 2021-09-19 DIAGNOSIS — O24410 Gestational diabetes mellitus in pregnancy, diet controlled: Secondary | ICD-10-CM

## 2021-09-19 DIAGNOSIS — O09299 Supervision of pregnancy with other poor reproductive or obstetric history, unspecified trimester: Secondary | ICD-10-CM

## 2021-09-19 DIAGNOSIS — Z8751 Personal history of pre-term labor: Secondary | ICD-10-CM | POA: Diagnosis not present

## 2021-09-19 DIAGNOSIS — Z3480 Encounter for supervision of other normal pregnancy, unspecified trimester: Secondary | ICD-10-CM

## 2021-09-19 MED ORDER — HYDROXYPROGESTERONE CAPROATE 275 MG/1.1ML ~~LOC~~ SOAJ
275.0000 mg | SUBCUTANEOUS | Status: AC
Start: 2021-09-19 — End: 2021-10-24
  Administered 2021-09-19 – 2021-10-17 (×3): 275 mg via SUBCUTANEOUS

## 2021-09-19 NOTE — Progress Notes (Signed)
° ° °  PRENATAL VISIT NOTE  Subjective:  Katherine Alvarez is a 36 y.o. [phone] at [redacted]w[redacted]d being seen today for ongoing prenatal care.  She is currently monitored for the following issues for this high-risk pregnancy and has IUFD at less than 20 weeks of gestation; Supervision of other normal pregnancy, antepartum; History of gestational diabetes in prior pregnancy, currently pregnant; History of incompetent cervix, currently pregnant; Language barrier affecting health care; Maternal obesity affecting pregnancy, antepartum; Vaginal itching; and Gestational diabetes mellitus (GDM), antepartum on their problem list.  Patient reports no complaints.  Contractions: Not present. Vag. Bleeding: None.  Movement: Present. Denies leaking of fluid.   The following portions of the patient's history were reviewed and updated as appropriate: allergies, current medications, past family history, past medical history, past social history, past surgical history and problem list.   Objective:   Vitals:   09/19/21 1535  BP: 100/68  Pulse: (!) 102  Weight: 251 lb (113.9 kg)    Fetal Status: Fetal Heart Rate (bpm): 140 Fundal Height: 32 cm Movement: Present     General:  Alert, oriented and cooperative. Patient is in no acute distress.  Skin: Skin is warm and dry. No rash noted.   Cardiovascular: Normal heart rate noted  Respiratory: Normal respiratory effort, no problems with respiration noted  Abdomen: Soft, gravid, appropriate for gestational age.  Pain/Pressure: Present     Pelvic: Cervical exam deferred        Extremities: Normal range of motion.     Mental Status: Normal mood and affect. Normal behavior. Normal judgment and thought content.   Assessment and Plan:  Pregnancy: [id number] at [redacted]w[redacted]d 1. Diet controlled gestational diabetes mellitus (GDM), antepartum No book bur reports fasting CBGs in the high 90s to low 100s. Postprandials are in the normal range. Add hs snack with protein and  exercise post dinner.  2. Supervision of other normal pregnancy, antepartum   3. History of incompetent cervix, currently pregnant 17 P weekly to continue.  Preterm labor symptoms and general obstetric precautions including but not limited to vaginal bleeding, contractions, leaking of fluid and fetal movement were reviewed in detail with the patient. Please refer to After Visit Summary for other counseling recommendations.   Return in 2 weeks (on 10/03/2021) for HRC, 17 P weekly.  Future Appointments  Date Time Provider Department Center  09/26/2021  1:40 PM [facility] NURSE [facility] None  09/26/2021  3:00 PM [facility]SE [facility]  09/26/2021  3:15 PM [facility]2 [facility]  10/03/2021  8:55 AM Adam Phenix, MD [facility] None  10/10/2021  9:00 AM [facility] NURSE [facility]  10/10/2021  9:15 AM [facility]2 [facility]  10/10/2021  3:40 PM [facility] NURSE [facility] None    Reva Bores, MD

## 2021-09-19 NOTE — Progress Notes (Signed)
Pt does not have record of glucose readings today.  Pt states all have been normal except fasting which has been a little elevated.

## 2021-09-26 ENCOUNTER — Other Ambulatory Visit: Payer: Self-pay | Admitting: Obstetrics

## 2021-09-26 ENCOUNTER — Other Ambulatory Visit: Payer: Self-pay

## 2021-09-26 ENCOUNTER — Ambulatory Visit (INDEPENDENT_AMBULATORY_CARE_PROVIDER_SITE_OTHER): Payer: Medicaid Other | Admitting: *Deleted

## 2021-09-26 ENCOUNTER — Ambulatory Visit: Payer: Medicaid Other | Attending: Obstetrics

## 2021-09-26 ENCOUNTER — Ambulatory Visit: Payer: Medicaid Other | Admitting: *Deleted

## 2021-09-26 VITALS — BP 119/77 | HR 90

## 2021-09-26 VITALS — BP 116/75 | HR 94

## 2021-09-26 DIAGNOSIS — O99213 Obesity complicating pregnancy, third trimester: Secondary | ICD-10-CM | POA: Diagnosis not present

## 2021-09-26 DIAGNOSIS — O99210 Obesity complicating pregnancy, unspecified trimester: Secondary | ICD-10-CM

## 2021-09-26 DIAGNOSIS — Z789 Other specified health status: Secondary | ICD-10-CM | POA: Insufficient documentation

## 2021-09-26 DIAGNOSIS — E669 Obesity, unspecified: Secondary | ICD-10-CM

## 2021-09-26 DIAGNOSIS — Z3A31 31 weeks gestation of pregnancy: Secondary | ICD-10-CM

## 2021-09-26 DIAGNOSIS — Z362 Encounter for other antenatal screening follow-up: Secondary | ICD-10-CM

## 2021-09-26 DIAGNOSIS — O09299 Supervision of pregnancy with other poor reproductive or obstetric history, unspecified trimester: Secondary | ICD-10-CM | POA: Insufficient documentation

## 2021-09-26 DIAGNOSIS — O09293 Supervision of pregnancy with other poor reproductive or obstetric history, third trimester: Secondary | ICD-10-CM | POA: Diagnosis not present

## 2021-09-26 DIAGNOSIS — Z8632 Personal history of gestational diabetes: Secondary | ICD-10-CM | POA: Diagnosis present

## 2021-09-26 MED ORDER — HYDROXYPROGESTERONE CAPROATE 275 MG/1.1ML ~~LOC~~ SOAJ
275.0000 mg | Freq: Once | SUBCUTANEOUS | Status: AC
  Administered 2021-09-26: 275 mg via SUBCUTANEOUS

## 2021-09-26 NOTE — Progress Notes (Signed)
Pt here for 17p. Denies any issues or concerns at this time. Injection given in Left Deltoid. Pt tolerated injection well. Patient confirmed appt for 10/03/21 for 17P and OB MD.

## 2021-10-03 ENCOUNTER — Other Ambulatory Visit: Payer: Self-pay

## 2021-10-03 ENCOUNTER — Encounter: Payer: Self-pay | Admitting: Obstetrics & Gynecology

## 2021-10-03 ENCOUNTER — Ambulatory Visit: Payer: Medicaid Other

## 2021-10-03 ENCOUNTER — Ambulatory Visit (INDEPENDENT_AMBULATORY_CARE_PROVIDER_SITE_OTHER): Payer: Medicaid Other | Admitting: Obstetrics & Gynecology

## 2021-10-03 VITALS — BP 110/75 | HR 87 | Wt 256.0 lb

## 2021-10-03 DIAGNOSIS — O09299 Supervision of pregnancy with other poor reproductive or obstetric history, unspecified trimester: Secondary | ICD-10-CM

## 2021-10-03 DIAGNOSIS — Z3A32 32 weeks gestation of pregnancy: Secondary | ICD-10-CM

## 2021-10-03 DIAGNOSIS — Z3480 Encounter for supervision of other normal pregnancy, unspecified trimester: Secondary | ICD-10-CM

## 2021-10-03 DIAGNOSIS — Z789 Other specified health status: Secondary | ICD-10-CM

## 2021-10-03 DIAGNOSIS — Z8751 Personal history of pre-term labor: Secondary | ICD-10-CM

## 2021-10-03 DIAGNOSIS — Z8632 Personal history of gestational diabetes: Secondary | ICD-10-CM

## 2021-10-03 DIAGNOSIS — O24410 Gestational diabetes mellitus in pregnancy, diet controlled: Secondary | ICD-10-CM

## 2021-10-03 DIAGNOSIS — O09293 Supervision of pregnancy with other poor reproductive or obstetric history, third trimester: Secondary | ICD-10-CM

## 2021-10-03 DIAGNOSIS — O99213 Obesity complicating pregnancy, third trimester: Secondary | ICD-10-CM

## 2021-10-03 DIAGNOSIS — O99210 Obesity complicating pregnancy, unspecified trimester: Secondary | ICD-10-CM

## 2021-10-03 MED ORDER — HYDROXYPROGESTERONE CAPROATE 275 MG/1.1ML ~~LOC~~ SOAJ
275.0000 mg | Freq: Once | SUBCUTANEOUS | Status: AC
  Administered 2021-10-03: 275 mg via SUBCUTANEOUS

## 2021-10-03 MED ORDER — METFORMIN HCL 500 MG PO TABS: 500.0000 mg | ORAL_TABLET | Freq: Every day | ORAL | 5 refills | Status: DC

## 2021-10-03 NOTE — Progress Notes (Signed)
° °  PRENATAL VISIT NOTE  Subjective:  Katherine Alvarez is a 36 y.o. [phone] at [redacted]w[redacted]d being seen today for ongoing prenatal care.  She is currently monitored for the following issues for this high-risk pregnancy and has IUFD at less than 20 weeks of gestation; Supervision of other normal pregnancy, antepartum; History of gestational diabetes in prior pregnancy, currently pregnant; History of incompetent cervix, currently pregnant; Language barrier affecting health care; Maternal obesity affecting pregnancy, antepartum; Vaginal itching; and Gestational diabetes mellitus (GDM), antepartum on their problem list.  Patient reports no complaints.  Contractions: Not present. Vag. Bleeding: None.  Movement: Present. Denies leaking of fluid.   The following portions of the patient's history were reviewed and updated as appropriate: allergies, current medications, past family history, past medical history, past social history, past surgical history and problem list.   Objective:   Vitals:   10/03/21 0856  BP: 110/75  Pulse: 87  Weight: 256 lb (116.1 kg)    Fetal Status: Fetal Heart Rate (bpm): 146   Movement: Present     General:  Alert, oriented and cooperative. Patient is in no acute distress.  Skin: Skin is warm and dry. No rash noted.   Cardiovascular: Normal heart rate noted  Respiratory: Normal respiratory effort, no problems with respiration noted  Abdomen: Soft, gravid, appropriate for gestational age.  Pain/Pressure: Absent     Pelvic: Cervical exam deferred        Extremities: Normal range of motion.  Edema: None  Mental Status: Normal mood and affect. Normal behavior. Normal judgment and thought content.   Assessment and Plan:  Pregnancy: [id number] at [redacted]w[redacted]d 1. History of gestational diabetes in prior pregnancy, currently pregnant FBS all out of range up to 109, PP 90% in range - [place] MFM FETAL BPP WO NON STRESS; Future - metFORMIN (GLUCOPHAGE) 500 MG tablet; Take 1 tablet  (500 mg total) by mouth at bedtime. 8 PM  Dispense: 30 tablet; Refill: 5  2. History of incompetent cervix, currently pregnant Weekly injections - HYDROXYprogesterone caproate (Makena) autoinjector 275 mg  3. Language barrier affecting health care Spanish interpreter present  4. Maternal obesity affecting pregnancy, antepartum Body mass index is 42.6 kg/m.   5. Supervision of other normal pregnancy, antepartum F/U US in 3 week  6. Diet controlled gestational diabetes mellitus (GDM), antepartum Add Metformin and begin weekly antenatal testing  Preterm labor symptoms and general obstetric precautions including but not limited to vaginal bleeding, contractions, leaking of fluid and fetal movement were reviewed in detail with the patient. Please refer to After Visit Summary for other counseling recommendations.   Return in about 1 week (around 10/10/2021) for 17 P.  Future Appointments  Date Time Provider [place]  10/10/2021  3:40 PM [place] None  10/17/2021  3:30 PM Woodroe Mode, MD [facility] None    Emeterio Reeve, MD

## 2021-10-03 NOTE — Progress Notes (Signed)
ROB 32.5 wks GDM, has log, reports fasting CBGs are high 17 P today Out of test strips and PNV now, advised that refills are available at her pharmacy.

## 2021-10-09 ENCOUNTER — Ambulatory Visit: Payer: Medicaid Other | Attending: Obstetrics & Gynecology

## 2021-10-09 ENCOUNTER — Ambulatory Visit: Payer: Medicaid Other | Admitting: *Deleted

## 2021-10-09 ENCOUNTER — Other Ambulatory Visit: Payer: Self-pay

## 2021-10-09 ENCOUNTER — Encounter: Payer: Self-pay | Admitting: *Deleted

## 2021-10-09 VITALS — BP 116/73 | HR 91

## 2021-10-09 DIAGNOSIS — O09299 Supervision of pregnancy with other poor reproductive or obstetric history, unspecified trimester: Secondary | ICD-10-CM

## 2021-10-09 DIAGNOSIS — Z603 Acculturation difficulty: Secondary | ICD-10-CM

## 2021-10-09 DIAGNOSIS — Z789 Other specified health status: Secondary | ICD-10-CM | POA: Insufficient documentation

## 2021-10-09 DIAGNOSIS — O99210 Obesity complicating pregnancy, unspecified trimester: Secondary | ICD-10-CM

## 2021-10-09 DIAGNOSIS — Z8632 Personal history of gestational diabetes: Secondary | ICD-10-CM | POA: Diagnosis not present

## 2021-10-10 ENCOUNTER — Other Ambulatory Visit: Payer: Self-pay | Admitting: *Deleted

## 2021-10-10 ENCOUNTER — Telehealth: Payer: Self-pay | Admitting: *Deleted

## 2021-10-10 ENCOUNTER — Ambulatory Visit: Payer: Medicaid Other

## 2021-10-10 ENCOUNTER — Ambulatory Visit (INDEPENDENT_AMBULATORY_CARE_PROVIDER_SITE_OTHER): Payer: Medicaid Other

## 2021-10-10 DIAGNOSIS — Z6835 Body mass index (BMI) 35.0-35.9, adult: Secondary | ICD-10-CM

## 2021-10-10 DIAGNOSIS — O021 Missed abortion: Secondary | ICD-10-CM

## 2021-10-10 DIAGNOSIS — O24415 Gestational diabetes mellitus in pregnancy, controlled by oral hypoglycemic drugs: Secondary | ICD-10-CM

## 2021-10-10 DIAGNOSIS — Z8759 Personal history of other complications of pregnancy, childbirth and the puerperium: Secondary | ICD-10-CM

## 2021-10-10 MED ORDER — HYDROXYPROGESTERONE CAPROATE 275 MG/1.1ML ~~LOC~~ SOAJ
275.0000 mg | Freq: Once | SUBCUTANEOUS | Status: AC
  Administered 2021-10-10: 16:00:00 275 mg via SUBCUTANEOUS

## 2021-10-10 NOTE — Progress Notes (Signed)
Patient presents for 17p injection. Injection given in right arm. Patient tolerated well. 

## 2021-10-10 NOTE — Telephone Encounter (Signed)
TC to [facility] in [place], [place] for Makena/ 17P refills.

## 2021-10-16 ENCOUNTER — Encounter: Payer: Self-pay | Admitting: *Deleted

## 2021-10-16 ENCOUNTER — Ambulatory Visit: Payer: Medicaid Other | Admitting: *Deleted

## 2021-10-16 ENCOUNTER — Other Ambulatory Visit: Payer: Self-pay

## 2021-10-16 ENCOUNTER — Ambulatory Visit: Payer: Medicaid Other | Attending: Obstetrics and Gynecology

## 2021-10-16 VITALS — BP 124/72 | HR 82

## 2021-10-16 DIAGNOSIS — O09299 Supervision of pregnancy with other poor reproductive or obstetric history, unspecified trimester: Secondary | ICD-10-CM | POA: Insufficient documentation

## 2021-10-16 DIAGNOSIS — Z789 Other specified health status: Secondary | ICD-10-CM | POA: Insufficient documentation

## 2021-10-16 DIAGNOSIS — O99210 Obesity complicating pregnancy, unspecified trimester: Secondary | ICD-10-CM | POA: Insufficient documentation

## 2021-10-16 DIAGNOSIS — Z8759 Personal history of other complications of pregnancy, childbirth and the puerperium: Secondary | ICD-10-CM | POA: Diagnosis present

## 2021-10-16 DIAGNOSIS — Z8632 Personal history of gestational diabetes: Secondary | ICD-10-CM | POA: Diagnosis present

## 2021-10-16 DIAGNOSIS — Z6835 Body mass index (BMI) 35.0-35.9, adult: Secondary | ICD-10-CM | POA: Insufficient documentation

## 2021-10-16 DIAGNOSIS — O24415 Gestational diabetes mellitus in pregnancy, controlled by oral hypoglycemic drugs: Secondary | ICD-10-CM | POA: Diagnosis present

## 2021-10-17 ENCOUNTER — Ambulatory Visit (INDEPENDENT_AMBULATORY_CARE_PROVIDER_SITE_OTHER): Payer: Medicaid Other | Admitting: Obstetrics & Gynecology

## 2021-10-17 VITALS — BP 112/74 | HR 106 | Wt 257.1 lb

## 2021-10-17 DIAGNOSIS — Z3480 Encounter for supervision of other normal pregnancy, unspecified trimester: Secondary | ICD-10-CM

## 2021-10-17 DIAGNOSIS — O09299 Supervision of pregnancy with other poor reproductive or obstetric history, unspecified trimester: Secondary | ICD-10-CM

## 2021-10-17 DIAGNOSIS — Z8632 Personal history of gestational diabetes: Secondary | ICD-10-CM

## 2021-10-17 DIAGNOSIS — O24410 Gestational diabetes mellitus in pregnancy, diet controlled: Secondary | ICD-10-CM | POA: Diagnosis not present

## 2021-10-17 MED ORDER — METFORMIN HCL 500 MG PO TABS: 1000.0000 mg | ORAL_TABLET | Freq: Every day | ORAL | 5 refills | Status: DC

## 2021-10-17 NOTE — Progress Notes (Signed)
Pt is concerned that the baby moves a lot & makes her stomach sore.Pt states also having a lot of numbness in thighs at night for the last 5 days.   Pt states forgot glucose logs, during the day lss than 120. Today fasting blood sugar was high.

## 2021-10-17 NOTE — Progress Notes (Signed)
° °  PRENATAL VISIT NOTE  Subjective:  Katherine Alvarez is a 36 y.o. [phone] at [redacted]w[redacted]d being seen today for ongoing prenatal care.  She is currently monitored for the following issues for this high-risk pregnancy and has IUFD at less than 20 weeks of gestation; Supervision of other normal pregnancy, antepartum; History of gestational diabetes in prior pregnancy, currently pregnant; History of incompetent cervix, currently pregnant; Language barrier affecting health care; Maternal obesity affecting pregnancy, antepartum; Vaginal itching; and Gestational diabetes mellitus (GDM), antepartum on their problem list.  Patient reports occasional contractions.  Contractions: Not present. Vag. Bleeding: None.  Movement: Present. Denies leaking of fluid.   The following portions of the patient's history were reviewed and updated as appropriate: allergies, current medications, past family history, past medical history, past social history, past surgical history and problem list.   Objective:   Vitals:   10/17/21 1534  BP: 112/74  Pulse: (!) 106  Weight: 257 lb 1.6 oz (116.6 kg)    Fetal Status: Fetal Heart Rate (bpm): 137   Movement: Present     General:  Alert, oriented and cooperative. Patient is in no acute distress.  Skin: Skin is warm and dry. No rash noted.   Cardiovascular: Normal heart rate noted  Respiratory: Normal respiratory effort, no problems with respiration noted  Abdomen: Soft, gravid, appropriate for gestational age.  Pain/Pressure: Absent     Pelvic: Cervical exam deferred        Extremities: Normal range of motion.  Edema: Trace  Mental Status: Normal mood and affect. Normal behavior. Normal judgment and thought content.   Assessment and Plan:  Pregnancy: G4P201 at [redacted]w[redacted]d 1. Diet controlled gestational diabetes mellitus (GDM), antepartum FBS 95-102, increase metformin  2. Supervision of other normal pregnancy, antepartum BPP 8/8 1/30  Preterm labor symptoms and  general obstetric precautions including but not limited to vaginal bleeding, contractions, leaking of fluid and fetal movement were reviewed in detail with the patient. Please refer to After Visit Summary for other counseling recommendations.   Return in about 2 weeks (around 10/31/2021).  Future Appointments  Date Time Provider [place]  10/23/2021  8:30 AM [facility] NURSE [facility]  10/23/2021  8:45 AM [facility] US7 [facility]  10/30/2021 11:00 AM [facility] NURSE [facility]  10/30/2021 11:15 AM [facility]2 [facility]  11/06/2021  9:30 AM [facility] NURSE [facility]  11/06/2021  9:45 AM [facility] US4 [facility] [place]    Emeterio Reeve, MD

## 2021-10-17 NOTE — Progress Notes (Signed)
Katherine Alvarez injected in left arm today, no complications.

## 2021-10-23 ENCOUNTER — Ambulatory Visit: Payer: Medicaid Other | Attending: Obstetrics and Gynecology

## 2021-10-23 ENCOUNTER — Encounter: Payer: Self-pay | Admitting: *Deleted

## 2021-10-23 ENCOUNTER — Ambulatory Visit: Payer: Medicaid Other | Admitting: *Deleted

## 2021-10-23 ENCOUNTER — Other Ambulatory Visit: Payer: Self-pay

## 2021-10-23 VITALS — BP 129/73 | HR 90

## 2021-10-23 DIAGNOSIS — E668 Other obesity: Secondary | ICD-10-CM | POA: Diagnosis not present

## 2021-10-23 DIAGNOSIS — Z6835 Body mass index (BMI) 35.0-35.9, adult: Secondary | ICD-10-CM | POA: Diagnosis present

## 2021-10-23 DIAGNOSIS — Z8759 Personal history of other complications of pregnancy, childbirth and the puerperium: Secondary | ICD-10-CM

## 2021-10-23 DIAGNOSIS — O99210 Obesity complicating pregnancy, unspecified trimester: Secondary | ICD-10-CM | POA: Diagnosis present

## 2021-10-23 DIAGNOSIS — Z8632 Personal history of gestational diabetes: Secondary | ICD-10-CM | POA: Diagnosis present

## 2021-10-23 DIAGNOSIS — Z3A35 35 weeks gestation of pregnancy: Secondary | ICD-10-CM

## 2021-10-23 DIAGNOSIS — O09299 Supervision of pregnancy with other poor reproductive or obstetric history, unspecified trimester: Secondary | ICD-10-CM

## 2021-10-23 DIAGNOSIS — Z789 Other specified health status: Secondary | ICD-10-CM

## 2021-10-23 DIAGNOSIS — O09293 Supervision of pregnancy with other poor reproductive or obstetric history, third trimester: Secondary | ICD-10-CM | POA: Diagnosis not present

## 2021-10-23 DIAGNOSIS — O99213 Obesity complicating pregnancy, third trimester: Secondary | ICD-10-CM

## 2021-10-23 DIAGNOSIS — O24415 Gestational diabetes mellitus in pregnancy, controlled by oral hypoglycemic drugs: Secondary | ICD-10-CM

## 2021-10-24 ENCOUNTER — Ambulatory Visit (INDEPENDENT_AMBULATORY_CARE_PROVIDER_SITE_OTHER): Payer: Medicaid Other | Admitting: *Deleted

## 2021-10-24 VITALS — BP 126/82 | HR 80

## 2021-10-24 DIAGNOSIS — Z8632 Personal history of gestational diabetes: Secondary | ICD-10-CM

## 2021-10-24 DIAGNOSIS — O99210 Obesity complicating pregnancy, unspecified trimester: Secondary | ICD-10-CM

## 2021-10-24 DIAGNOSIS — O09293 Supervision of pregnancy with other poor reproductive or obstetric history, third trimester: Secondary | ICD-10-CM | POA: Diagnosis not present

## 2021-10-24 DIAGNOSIS — O09299 Supervision of pregnancy with other poor reproductive or obstetric history, unspecified trimester: Secondary | ICD-10-CM

## 2021-10-24 DIAGNOSIS — Z3A35 35 weeks gestation of pregnancy: Secondary | ICD-10-CM | POA: Diagnosis not present

## 2021-10-24 DIAGNOSIS — Z789 Other specified health status: Secondary | ICD-10-CM

## 2021-10-24 MED ORDER — HYDROXYPROGESTERONE CAPROATE 275 MG/1.1ML ~~LOC~~ SOAJ
275.0000 mg | Freq: Once | SUBCUTANEOUS | Status: AC
  Administered 2021-10-24: 275 mg via SUBCUTANEOUS

## 2021-10-24 NOTE — Progress Notes (Signed)
Patient presents for 17P injection. Injection given in left arm. Patient tolerated well. This is last 17P for patient. Confirmed returning for MD appt 2/15 for 36 weeks GBS, GC/CC.

## 2021-10-30 ENCOUNTER — Other Ambulatory Visit: Payer: Self-pay

## 2021-10-30 ENCOUNTER — Ambulatory Visit: Payer: Medicaid Other | Attending: Obstetrics and Gynecology

## 2021-10-30 ENCOUNTER — Ambulatory Visit: Payer: Medicaid Other | Admitting: *Deleted

## 2021-10-30 ENCOUNTER — Encounter: Payer: Self-pay | Admitting: *Deleted

## 2021-10-30 VITALS — BP 139/80 | HR 91

## 2021-10-30 DIAGNOSIS — Z6835 Body mass index (BMI) 35.0-35.9, adult: Secondary | ICD-10-CM | POA: Diagnosis present

## 2021-10-30 DIAGNOSIS — Z3A36 36 weeks gestation of pregnancy: Secondary | ICD-10-CM | POA: Diagnosis not present

## 2021-10-30 DIAGNOSIS — O24415 Gestational diabetes mellitus in pregnancy, controlled by oral hypoglycemic drugs: Secondary | ICD-10-CM | POA: Insufficient documentation

## 2021-10-30 DIAGNOSIS — O09299 Supervision of pregnancy with other poor reproductive or obstetric history, unspecified trimester: Secondary | ICD-10-CM | POA: Diagnosis present

## 2021-10-30 DIAGNOSIS — Z789 Other specified health status: Secondary | ICD-10-CM

## 2021-10-30 DIAGNOSIS — Z8759 Personal history of other complications of pregnancy, childbirth and the puerperium: Secondary | ICD-10-CM | POA: Insufficient documentation

## 2021-10-30 DIAGNOSIS — Z8632 Personal history of gestational diabetes: Secondary | ICD-10-CM | POA: Insufficient documentation

## 2021-10-30 DIAGNOSIS — O99213 Obesity complicating pregnancy, third trimester: Secondary | ICD-10-CM | POA: Diagnosis not present

## 2021-10-30 DIAGNOSIS — O99210 Obesity complicating pregnancy, unspecified trimester: Secondary | ICD-10-CM | POA: Insufficient documentation

## 2021-11-01 ENCOUNTER — Other Ambulatory Visit: Payer: Self-pay

## 2021-11-01 ENCOUNTER — Ambulatory Visit (INDEPENDENT_AMBULATORY_CARE_PROVIDER_SITE_OTHER): Payer: Medicaid Other | Admitting: Obstetrics & Gynecology

## 2021-11-01 ENCOUNTER — Other Ambulatory Visit (HOSPITAL_COMMUNITY)
Admission: RE | Admit: 2021-11-01 | Discharge: 2021-11-01 | Disposition: A | Discharge status: Home / Self Care | Payer: Medicaid Other | Source: Ambulatory Visit | Attending: Obstetrics & Gynecology | Admitting: Obstetrics & Gynecology

## 2021-11-01 VITALS — BP 128/81 | HR 95 | Wt 258.0 lb

## 2021-11-01 DIAGNOSIS — Z3480 Encounter for supervision of other normal pregnancy, unspecified trimester: Secondary | ICD-10-CM

## 2021-11-01 DIAGNOSIS — O24415 Gestational diabetes mellitus in pregnancy, controlled by oral hypoglycemic drugs: Secondary | ICD-10-CM

## 2021-11-01 DIAGNOSIS — O99210 Obesity complicating pregnancy, unspecified trimester: Secondary | ICD-10-CM

## 2021-11-01 LAB — OB RESULTS CONSOLE GC/CHLAMYDIA: Gonorrhea: NEGATIVE

## 2021-11-01 NOTE — Progress Notes (Signed)
ROB/GBS. Pt wants her cx checked.

## 2021-11-01 NOTE — Progress Notes (Signed)
° °  PRENATAL VISIT NOTE  Subjective:   Katherine Alvarez is a 36 y.o. [phone] at [redacted]w[redacted]d being seen today for ongoing prenatal care.  She is currently monitored for the following issues for this high-risk pregnancy and has IUFD at less than 20 weeks of gestation; Supervision of other normal pregnancy, antepartum; History of gestational diabetes in prior pregnancy, currently pregnant; History of incompetent cervix, currently pregnant; Language barrier affecting health care; Maternal obesity affecting pregnancy, antepartum; Vaginal itching; and Gestational diabetes mellitus (GDM), antepartum on their problem list.  Patient reports no complaints.  Contractions: Not present. Vag. Bleeding: None.  Movement: Present. Denies leaking of fluid.   The following portions of the patient's history were reviewed and updated as appropriate: allergies, current medications, past family history, past medical history, past social history, past surgical history and problem list.   Objective:   Vitals:   11/01/21 1339  BP: 128/81  Pulse: 95  Weight: 258 lb (117 kg)    Fetal Status: Fetal Heart Rate (bpm): 154   Movement: Present  Presentation: Vertex  General:  Alert, oriented and cooperative. Patient is in no acute distress.  Skin: Skin is warm and dry. No rash noted.   Cardiovascular: Normal heart rate noted  Respiratory: Normal respiratory effort, no problems with respiration noted  Abdomen: Soft, gravid, appropriate for gestational age.  Pain/Pressure: Present     Pelvic: Cervical exam performed in the presence of a chaperone Dilation: 1.5 Effacement (%): 30 Station: -3  Extremities: Normal range of motion.  Edema: None  Mental Status: Normal mood and affect. Normal behavior. Normal judgment and thought content.   Assessment and Plan:  Pregnancy: [id number] at [redacted]w[redacted]d 1. Supervision of other normal pregnancy, antepartum 36 week labs - Cervicovaginal ancillary only( [place]) - Culture, beta  strep (group b only)  2. gestational diabetes mellitus (GDM), antepartum FBS 95, PP <120  3. Maternal obesity affecting pregnancy, antepartum Body mass index is 42.93 kg/m.   Term labor symptoms and general obstetric precautions including but not limited to vaginal bleeding, contractions, leaking of fluid and fetal movement were reviewed in detail with the patient. Please refer to After Visit Summary for other counseling recommendations.   Return in about 1 week (around 11/08/2021).  Future Appointments  Date Time Provider [place]  11/06/2021  9:30 AM [facility] NURSE [facility]  11/06/2021  9:45 AM [facility] US4 [facility]  11/09/2021  8:35 AM Shelly Bombard, MD [facility] None    Emeterio Reeve, MD

## 2021-11-02 LAB — CERVICOVAGINAL ANCILLARY ONLY
Chlamydia: NEGATIVE
Comment: NEGATIVE
Comment: NORMAL
Neisseria Gonorrhea: NEGATIVE

## 2021-11-05 LAB — CULTURE, BETA STREP (GROUP B ONLY): Strep Gp B Culture: NEGATIVE

## 2021-11-06 ENCOUNTER — Ambulatory Visit: Payer: Medicaid Other | Attending: Obstetrics

## 2021-11-06 ENCOUNTER — Encounter: Payer: Self-pay | Admitting: *Deleted

## 2021-11-06 ENCOUNTER — Other Ambulatory Visit: Payer: Self-pay

## 2021-11-06 ENCOUNTER — Ambulatory Visit: Payer: Medicaid Other | Admitting: *Deleted

## 2021-11-06 VITALS — BP 137/78 | HR 84

## 2021-11-06 DIAGNOSIS — O99210 Obesity complicating pregnancy, unspecified trimester: Secondary | ICD-10-CM | POA: Diagnosis present

## 2021-11-06 DIAGNOSIS — O24415 Gestational diabetes mellitus in pregnancy, controlled by oral hypoglycemic drugs: Secondary | ICD-10-CM | POA: Diagnosis not present

## 2021-11-06 DIAGNOSIS — Z8632 Personal history of gestational diabetes: Secondary | ICD-10-CM | POA: Diagnosis present

## 2021-11-06 DIAGNOSIS — O09293 Supervision of pregnancy with other poor reproductive or obstetric history, third trimester: Secondary | ICD-10-CM

## 2021-11-06 DIAGNOSIS — Z789 Other specified health status: Secondary | ICD-10-CM | POA: Diagnosis present

## 2021-11-06 DIAGNOSIS — Z3A37 37 weeks gestation of pregnancy: Secondary | ICD-10-CM | POA: Diagnosis not present

## 2021-11-06 DIAGNOSIS — O09299 Supervision of pregnancy with other poor reproductive or obstetric history, unspecified trimester: Secondary | ICD-10-CM | POA: Diagnosis present

## 2021-11-06 DIAGNOSIS — Z6835 Body mass index (BMI) 35.0-35.9, adult: Secondary | ICD-10-CM | POA: Insufficient documentation

## 2021-11-06 DIAGNOSIS — Z8759 Personal history of other complications of pregnancy, childbirth and the puerperium: Secondary | ICD-10-CM | POA: Diagnosis present

## 2021-11-06 IMAGING — US US MFM FETAL BPP W/O NON-STRESS
1 series · 15 of 28 positions shown · non-contrast
Comparison: none

[Series 1: us mfm fetal bpp w/o non-stress · 36 acquisitions, 15 frames shown]
[im 1/36]
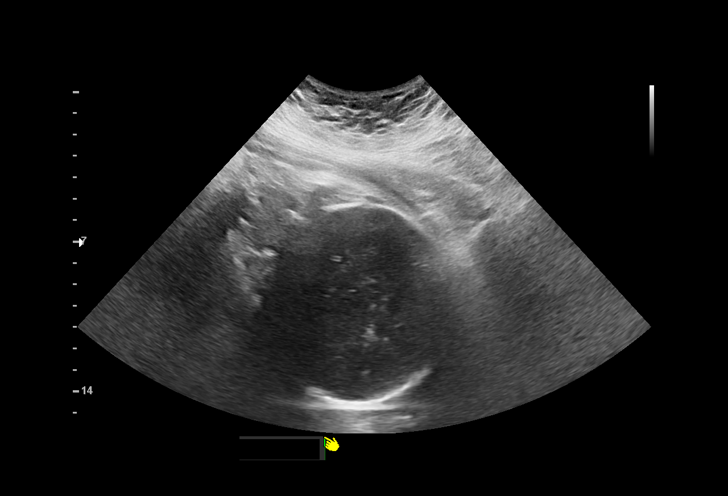
[im 3/36]
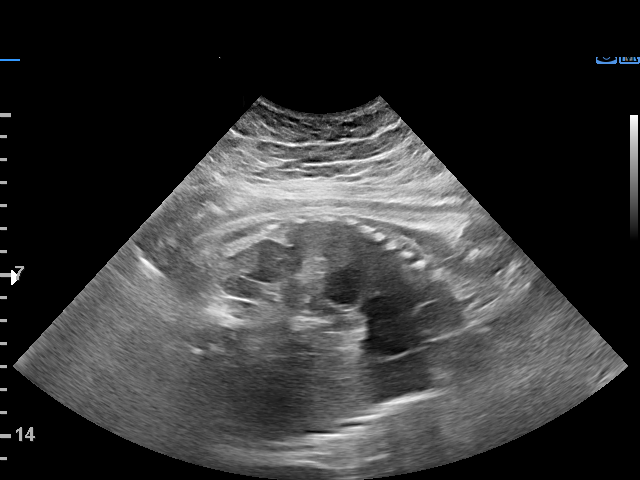
[im 6/36]
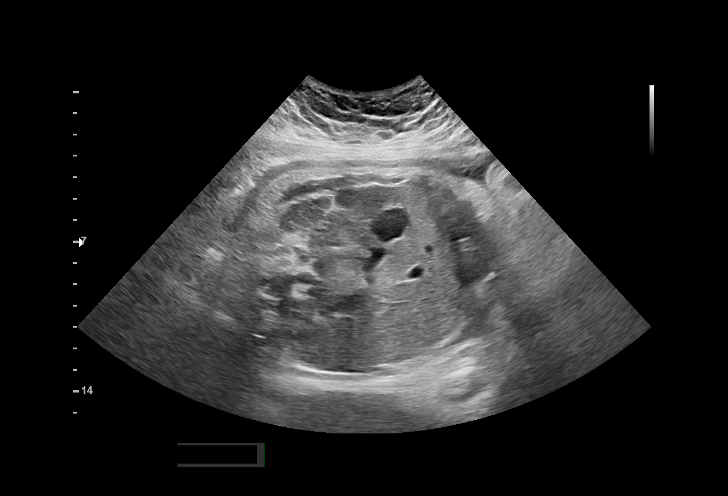
[im 8/36]
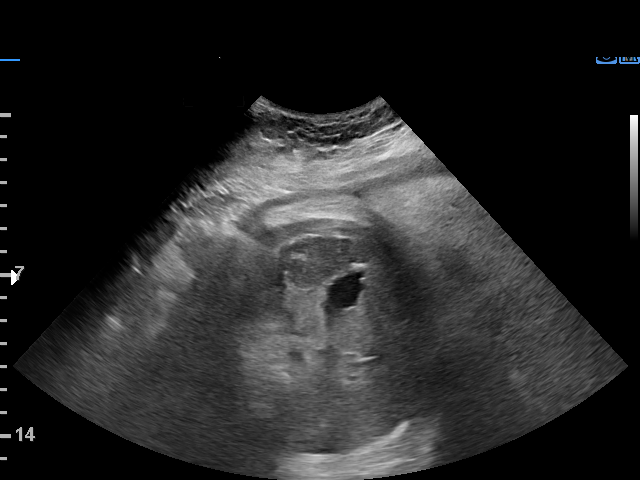
[im 11/36]
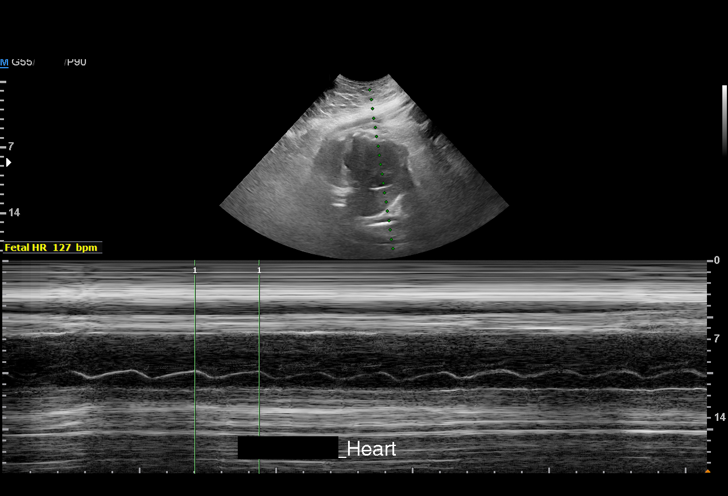
[im 13/36]
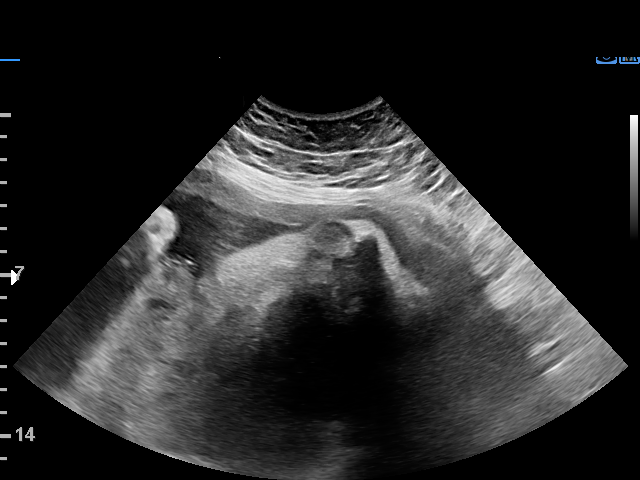
[im 16/36]
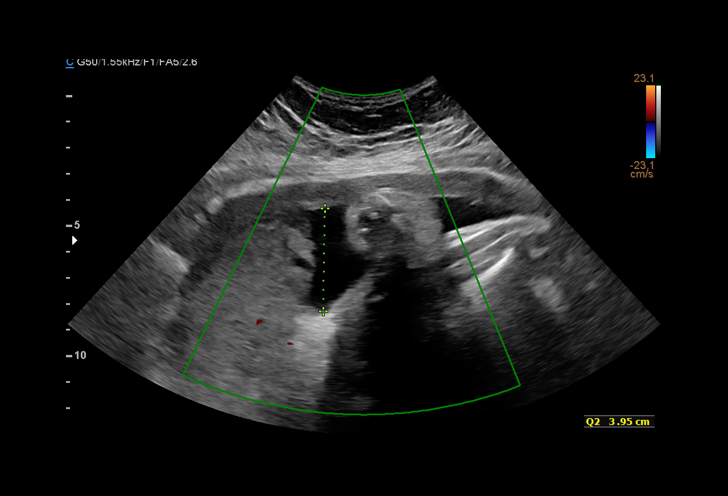
[im 19/36]
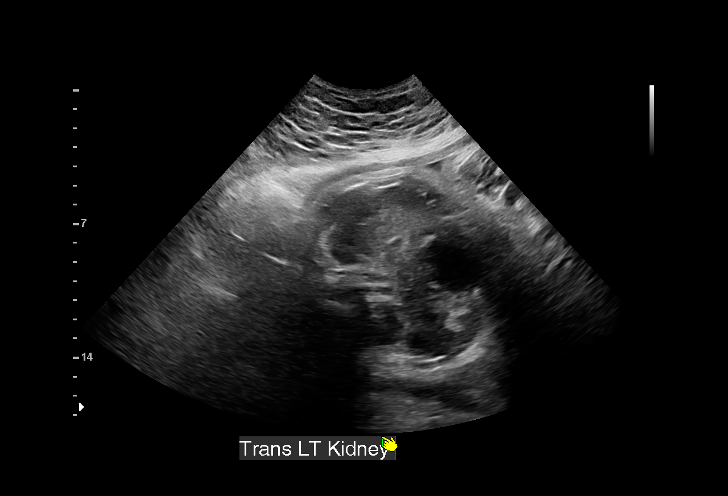
[im 20/36]
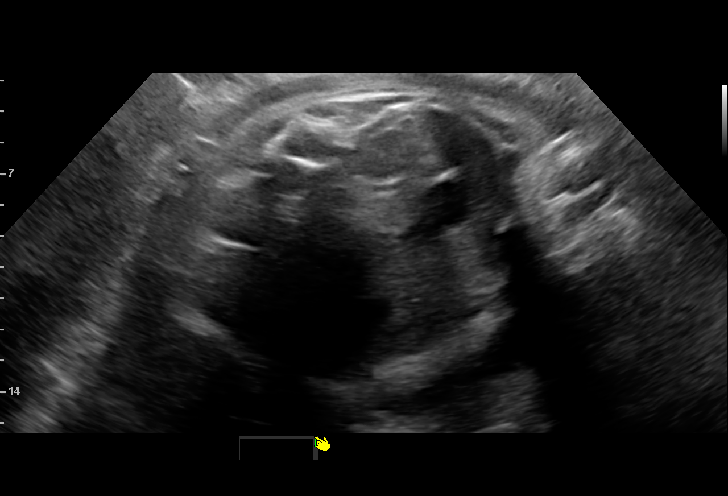
[im 23/36]
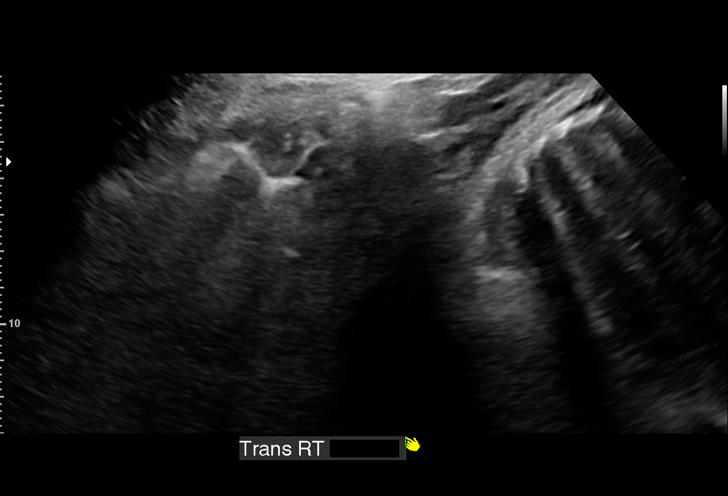
[im 25/36]
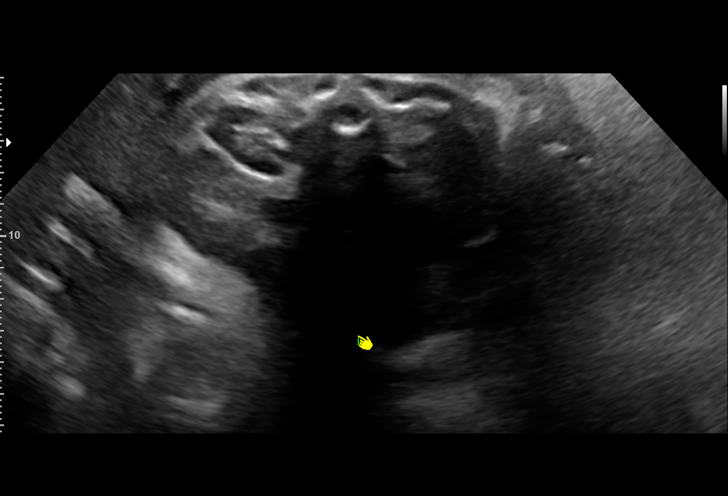
[im 28/36]
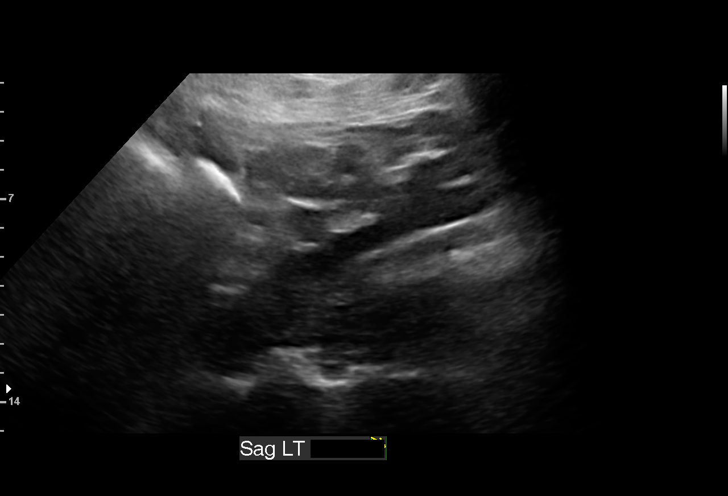
[im 30/36]
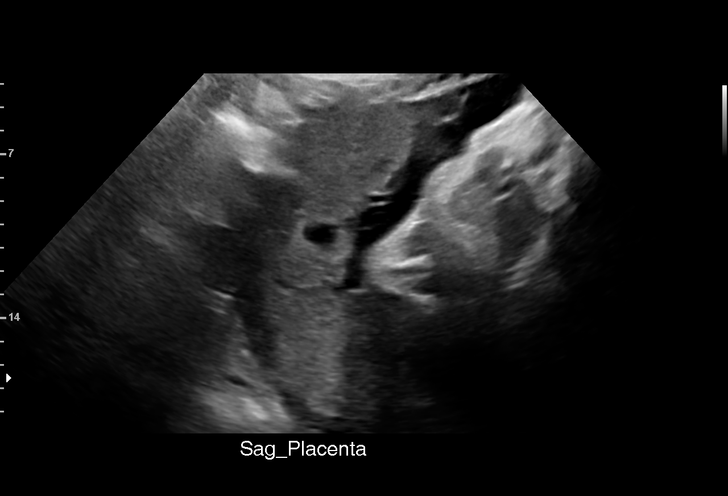
[im 33/36]
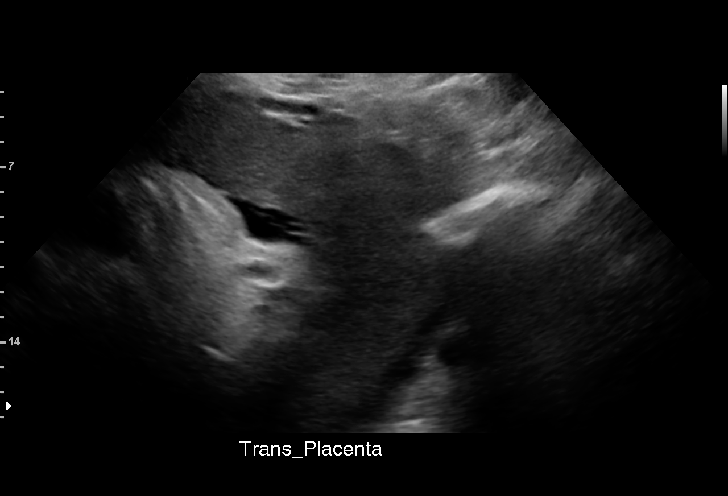
[im 36/36]
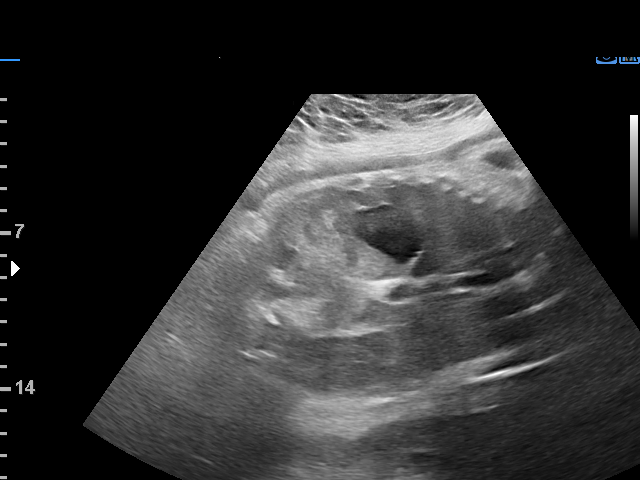

[15 of 28 positions shown; findings below may reference images not displayed]

[REDACTED]
 Ref. Address:     Faculty

Indications

 37 weeks gestation of pregnancy
 Gestational diabetes in pregnancy,             [4A]
 controlled by oral hypoglycemic drugs
 Obesity complicating pregnancy, third          [4A]
 trimester (pregravid BMI 35)
 Poor obstetric history: Previous gestational   [4A]
 diabetes
 Low lying placenta, antepartum (Resolved)      [4A]
 Poor obstetric history: Previous midtrimester  [4A]
 loss (cervical incompetence)
 History of cervical cerclage, currently        [4A]
 pregnant
 LR NIPS/ Negative Horizon
 Encounter for other antenatal screening        [4A]
 follow-up
Fetal Evaluation

 Num Of Fetuses:         1
 Cardiac Activity:       Observed
 Presentation:           Cephalic
 Placenta:               Posterior
 P. Cord Insertion:      Previously Visualized
 Amniotic Fluid
 AFI FV:      Within normal limits
Biophysical Evaluation

 Amniotic F.V:   Pocket => 2 cm             F. Tone:        Observed
 F. Movement:    Observed                   Score:          [DATE]
 F. Breathing:   Observed
OB History

 Blood Type:   O+
 Gravidity:    4         Term:   2        Prem:   0        SAB:   1
 TOP:          0       Ectopic:  0        Living: 2
Gestational Age

 LMP:           38w 5d        Date:  [DATE]                 EDD:   [DATE]
 Best:          37w 4d     Det. By:  Early Ultrasound         EDD:   [DATE]
                                     ([DATE])
Anatomy

 Ventricles:            Appears normal         Stomach:                Appears normal, left
                                                                       sided
 Thoracic:              Appears normal         Kidneys:                Appear normal
 Heart:                 Previously seen        Bladder:                Appears normal
 Diaphragm:             Appears normal
Cervix Uterus Adnexa

 Cervix
 Not visualized (advanced GA >[4A])

 Uterus
 No abnormality visualized.

 Right Ovary
 Not visualized.

 Left Ovary
 Not visualized.

 Cul De Sac
 No free fluid seen.

 Adnexa
 No abnormality visualized.
Impression

 Gestational diabetes.  Patient takes metformin and reports
 her blood glucose levels are within normal range.  Blood
 pressure today at her office is 137/78 mmHg.

 Amniotic fluid is normal and good fetal activity is seen
 .Antenatal testing is reassuring. BPP [DATE].  Cephalic
 presentation

 I reassured the patient with help of Spanish language
 interpreter present in the room.
Recommendations

 -Delivery at 39 weeks gestation.
 -No follow-up appointments were ma[REDACTED]

## 2021-11-09 ENCOUNTER — Encounter (HOSPITAL_COMMUNITY): Payer: Self-pay | Admitting: *Deleted

## 2021-11-09 ENCOUNTER — Ambulatory Visit (INDEPENDENT_AMBULATORY_CARE_PROVIDER_SITE_OTHER): Payer: Medicaid Other | Admitting: Obstetrics

## 2021-11-09 ENCOUNTER — Other Ambulatory Visit: Payer: Self-pay

## 2021-11-09 ENCOUNTER — Telehealth (HOSPITAL_COMMUNITY): Payer: Self-pay | Admitting: *Deleted

## 2021-11-09 ENCOUNTER — Encounter: Payer: Self-pay | Admitting: Obstetrics

## 2021-11-09 VITALS — BP 140/89 | HR 78 | Wt 265.5 lb

## 2021-11-09 DIAGNOSIS — O99210 Obesity complicating pregnancy, unspecified trimester: Secondary | ICD-10-CM

## 2021-11-09 DIAGNOSIS — O09299 Supervision of pregnancy with other poor reproductive or obstetric history, unspecified trimester: Secondary | ICD-10-CM

## 2021-11-09 DIAGNOSIS — Z789 Other specified health status: Secondary | ICD-10-CM

## 2021-11-09 DIAGNOSIS — O0990 Supervision of high risk pregnancy, unspecified, unspecified trimester: Secondary | ICD-10-CM

## 2021-11-09 DIAGNOSIS — O24415 Gestational diabetes mellitus in pregnancy, controlled by oral hypoglycemic drugs: Secondary | ICD-10-CM

## 2021-11-09 NOTE — Progress Notes (Signed)
Subjective:  Katherine Alvarez is a 36 y.o. [phone] at [redacted]w[redacted]d being seen today for ongoing prenatal care.  She is currently monitored for the following issues for this high-risk pregnancy and has IUFD at less than 20 weeks of gestation; Supervision of other normal pregnancy, antepartum; History of gestational diabetes in prior pregnancy, currently pregnant; History of incompetent cervix, currently pregnant; Language barrier affecting health care; Maternal obesity affecting pregnancy, antepartum; Vaginal itching; and Gestational diabetes mellitus (GDM), antepartum on their problem list.  Patient reports heartburn.  Contractions: Not present. Vag. Bleeding: None.  Movement: Present. Denies leaking of fluid.   The following portions of the patient's history were reviewed and updated as appropriate: allergies, current medications, past family history, past medical history, past social history, past surgical history and problem list. Problem list updated.  Objective:   Vitals:   11/09/21 0827  BP: 140/89  Pulse: 78  Weight: 265 lb 8 oz (120.4 kg)    Fetal Status: Fetal Heart Rate (bpm): 145   Movement: Present     General:  Alert, oriented and cooperative. Patient is in no acute distress.  Skin: Skin is warm and dry. No rash noted.   Cardiovascular: Normal heart rate noted  Respiratory: Normal respiratory effort, no problems with respiration noted  Abdomen: Soft, gravid, appropriate for gestational age. Pain/Pressure: Present     Pelvic:  Cervical exam deferred        Extremities: Normal range of motion.  Edema: None  Mental Status: Normal mood and affect. Normal behavior. Normal judgment and thought content.   Urinalysis:      Assessment and Plan:  Pregnancy: [id number] at [redacted]w[redacted]d  1. High risk pregnancy, antepartum - doing well  2. Gestational diabetes mellitus (GDM) in third trimester controlled on oral hypoglycemic drug  good glucose control:  FBS < 95 and 2 hour PP <  120  3. History of incompetent cervix, currently pregnant - clinically stable   4. Language barrier affecting health care  5. Maternal obesity affecting pregnancy, antepartum    Term labor symptoms and general obstetric precautions including but not limited to vaginal bleeding, contractions, leaking of fluid and fetal movement were reviewed in detail with the patient. Please refer to After Visit Summary for other counseling recommendations.   Return in about 1 week (around 11/16/2021) for [facility].   Shelly Bombard, MD  11/09/21

## 2021-11-09 NOTE — Telephone Encounter (Signed)
Interpreter number [phone]

## 2021-11-09 NOTE — Telephone Encounter (Signed)
Preadmission screen Interpreter number  

## 2021-11-10 ENCOUNTER — Other Ambulatory Visit: Payer: Self-pay | Admitting: Advanced Practice Midwife

## 2021-11-16 ENCOUNTER — Other Ambulatory Visit: Payer: Self-pay

## 2021-11-16 ENCOUNTER — Inpatient Hospital Stay (HOSPITAL_COMMUNITY)
Admission: AD | Admit: 2021-11-16 | Discharge: 2021-11-18 | DRG: 807 | Disposition: A | Discharge status: Home / Self Care | Payer: Medicaid Other | Attending: Obstetrics & Gynecology | Admitting: Obstetrics & Gynecology

## 2021-11-16 ENCOUNTER — Inpatient Hospital Stay (HOSPITAL_COMMUNITY): Payer: Medicaid Other | Admitting: Anesthesiology

## 2021-11-16 ENCOUNTER — Inpatient Hospital Stay (HOSPITAL_COMMUNITY): Payer: Medicaid Other

## 2021-11-16 ENCOUNTER — Encounter: Payer: Medicaid Other | Admitting: Obstetrics and Gynecology

## 2021-11-16 ENCOUNTER — Encounter (HOSPITAL_COMMUNITY): Payer: Self-pay | Admitting: Obstetrics & Gynecology

## 2021-11-16 DIAGNOSIS — Z20822 Contact with and (suspected) exposure to covid-19: Secondary | ICD-10-CM | POA: Diagnosis present

## 2021-11-16 DIAGNOSIS — O134 Gestational [pregnancy-induced] hypertension without significant proteinuria, complicating childbirth: Secondary | ICD-10-CM | POA: Diagnosis present

## 2021-11-16 DIAGNOSIS — O24425 Gestational diabetes mellitus in childbirth, controlled by oral hypoglycemic drugs: Principal | ICD-10-CM | POA: Diagnosis present

## 2021-11-16 DIAGNOSIS — O99210 Obesity complicating pregnancy, unspecified trimester: Secondary | ICD-10-CM

## 2021-11-16 DIAGNOSIS — Z23 Encounter for immunization: Secondary | ICD-10-CM

## 2021-11-16 DIAGNOSIS — Z8632 Personal history of gestational diabetes: Secondary | ICD-10-CM

## 2021-11-16 DIAGNOSIS — O09299 Supervision of pregnancy with other poor reproductive or obstetric history, unspecified trimester: Secondary | ICD-10-CM

## 2021-11-16 DIAGNOSIS — Z3A39 39 weeks gestation of pregnancy: Secondary | ICD-10-CM

## 2021-11-16 DIAGNOSIS — O24419 Gestational diabetes mellitus in pregnancy, unspecified control: Secondary | ICD-10-CM | POA: Diagnosis present

## 2021-11-16 DIAGNOSIS — Z3480 Encounter for supervision of other normal pregnancy, unspecified trimester: Secondary | ICD-10-CM

## 2021-11-16 DIAGNOSIS — O24424 Gestational diabetes mellitus in childbirth, insulin controlled: Secondary | ICD-10-CM | POA: Diagnosis not present

## 2021-11-16 DIAGNOSIS — Z789 Other specified health status: Secondary | ICD-10-CM

## 2021-11-16 DIAGNOSIS — O139 Gestational [pregnancy-induced] hypertension without significant proteinuria, unspecified trimester: Secondary | ICD-10-CM | POA: Clinically undetermined

## 2021-11-16 LAB — PROTEIN / CREATININE RATIO, URINE
Creatinine, Urine: 20.46 mg/dL
Protein Creatinine Ratio: 0.29 mg/mg{Cre} — ABNORMAL HIGH (ref 0.00–0.15)
Total Protein, Urine: 6 mg/dL

## 2021-11-16 LAB — TYPE AND SCREEN
ABO/RH(D): O POS
Antibody Screen: NEGATIVE

## 2021-11-16 LAB — RESP PANEL BY RT-PCR (FLU A&B, COVID) ARPGX2
Influenza A by PCR: NEGATIVE
Influenza B by PCR: NEGATIVE
SARS Coronavirus 2 by RT PCR: NEGATIVE

## 2021-11-16 LAB — CBC
HCT: 36.9 % (ref 36.0–46.0)
Hemoglobin: 12.5 g/dL (ref 12.0–15.0)
MCH: 30.5 pg (ref 26.0–34.0)
MCHC: 33.9 g/dL (ref 30.0–36.0)
MCV: 90 fL (ref 80.0–100.0)
Platelets: 163 10*3/uL (ref 150–400)
RBC: 4.1 MIL/uL (ref 3.87–5.11)
RDW: 14.1 % (ref 11.5–15.5)
WBC: 9.7 10*3/uL (ref 4.0–10.5)
nRBC: 0 % (ref 0.0–0.2)

## 2021-11-16 LAB — RPR: RPR Ser Ql: NONREACTIVE

## 2021-11-16 LAB — GLUCOSE, CAPILLARY
Glucose-Capillary: 97 mg/dL (ref 70–99)
Glucose-Capillary: 88 mg/dL (ref 70–99)
Glucose-Capillary: 88 mg/dL (ref 70–99)

## 2021-11-16 MED ORDER — TERBUTALINE SULFATE 1 MG/ML IJ SOLN: 0.2500 mg | Freq: Once | INTRAMUSCULAR | Status: DC | PRN

## 2021-11-16 MED ORDER — SENNOSIDES-DOCUSATE SODIUM 8.6-50 MG PO TABS
2.0000 | ORAL_TABLET | Freq: Every day | ORAL | Status: DC
  Administered 2021-11-17 – 2021-11-18 (×2): 2 via ORAL
  Filled 2021-11-16 (×2): qty 2

## 2021-11-16 MED ORDER — MISOPROSTOL 25 MCG QUARTER TABLET: 25.0000 ug | ORAL_TABLET | ORAL | Status: DC | PRN

## 2021-11-16 MED ORDER — FENTANYL CITRATE (PF) 100 MCG/2ML IJ SOLN
50.0000 ug | INTRAMUSCULAR | Status: DC | PRN
  Administered 2021-11-16: 100 ug via INTRAVENOUS
  Filled 2021-11-16: qty 2

## 2021-11-16 MED ORDER — LACTATED RINGERS IV SOLN: 500.0000 mL | Freq: Once | INTRAVENOUS | Status: DC

## 2021-11-16 MED ORDER — LIDOCAINE HCL (PF) 1 % IJ SOLN: 30.0000 mL | INTRAMUSCULAR | Status: DC | PRN

## 2021-11-16 MED ORDER — SIMETHICONE 80 MG PO CHEW: 80.0000 mg | CHEWABLE_TABLET | ORAL | Status: DC | PRN

## 2021-11-16 MED ORDER — ACETAMINOPHEN 325 MG PO TABS
650.0000 mg | ORAL_TABLET | ORAL | Status: DC | PRN
  Administered 2021-11-17 (×4): 650 mg via ORAL
  Filled 2021-11-16 (×4): qty 2

## 2021-11-16 MED ORDER — OXYTOCIN-SODIUM CHLORIDE 30-0.9 UT/500ML-% IV SOLN
2.5000 [IU]/h | INTRAVENOUS | Status: DC
  Filled 2021-11-16: qty 500

## 2021-11-16 MED ORDER — DIPHENHYDRAMINE HCL 25 MG PO CAPS: 25.0000 mg | ORAL_CAPSULE | Freq: Four times a day (QID) | ORAL | Status: DC | PRN

## 2021-11-16 MED ORDER — HYDROXYZINE HCL 50 MG PO TABS: 50.0000 mg | ORAL_TABLET | Freq: Four times a day (QID) | ORAL | Status: DC | PRN

## 2021-11-16 MED ORDER — MEDROXYPROGESTERONE ACETATE 150 MG/ML IM SUSP
150.0000 mg | INTRAMUSCULAR | Status: AC | PRN
  Administered 2021-11-18: 150 mg via INTRAMUSCULAR
  Filled 2021-11-16: qty 1

## 2021-11-16 MED ORDER — DIPHENHYDRAMINE HCL 50 MG/ML IJ SOLN: 12.5000 mg | INTRAMUSCULAR | Status: DC | PRN

## 2021-11-16 MED ORDER — MEASLES, MUMPS & RUBELLA VAC IJ SOLR: 0.5000 mL | Freq: Once | INTRAMUSCULAR | Status: DC

## 2021-11-16 MED ORDER — PRENATAL MULTIVITAMIN CH
1.0000 | ORAL_TABLET | Freq: Every day | ORAL | Status: DC
  Administered 2021-11-17 – 2021-11-18 (×2): 1 via ORAL
  Filled 2021-11-16 (×2): qty 1

## 2021-11-16 MED ORDER — OXYTOCIN-SODIUM CHLORIDE 30-0.9 UT/500ML-% IV SOLN
1.0000 m[IU]/min | INTRAVENOUS | Status: DC
  Administered 2021-11-16: 2 m[IU]/min via INTRAVENOUS
  Filled 2021-11-16: qty 500

## 2021-11-16 MED ORDER — FERROUS SULFATE 325 (65 FE) MG PO TABS
325.0000 mg | ORAL_TABLET | ORAL | Status: DC
  Administered 2021-11-17: 325 mg via ORAL
  Filled 2021-11-16: qty 1

## 2021-11-16 MED ORDER — OXYTOCIN BOLUS FROM INFUSION
333.0000 mL | Freq: Once | INTRAVENOUS | Status: AC
  Administered 2021-11-16: 333 mL via INTRAVENOUS

## 2021-11-16 MED ORDER — LACTATED RINGERS IV SOLN
INTRAVENOUS | Status: DC
  Administered 2021-11-16: 125 mL/h via INTRAVENOUS

## 2021-11-16 MED ORDER — WITCH HAZEL-GLYCERIN EX PADS: 1.0000 "application " | MEDICATED_PAD | CUTANEOUS | Status: DC | PRN

## 2021-11-16 MED ORDER — PHENYLEPHRINE 40 MCG/ML (10ML) SYRINGE FOR IV PUSH (FOR BLOOD PRESSURE SUPPORT): 80.0000 ug | PREFILLED_SYRINGE | INTRAVENOUS | Status: DC | PRN

## 2021-11-16 MED ORDER — OXYCODONE-ACETAMINOPHEN 5-325 MG PO TABS: 1.0000 | ORAL_TABLET | ORAL | Status: DC | PRN

## 2021-11-16 MED ORDER — FENTANYL-BUPIVACAINE-NACL 0.5-0.125-0.9 MG/250ML-% EP SOLN
12.0000 mL/h | EPIDURAL | Status: DC | PRN
  Administered 2021-11-16: 12 mL/h via EPIDURAL
  Filled 2021-11-16: qty 250

## 2021-11-16 MED ORDER — IBUPROFEN 600 MG PO TABS
600.0000 mg | ORAL_TABLET | Freq: Four times a day (QID) | ORAL | Status: DC
  Administered 2021-11-16 – 2021-11-18 (×7): 600 mg via ORAL
  Filled 2021-11-16 (×7): qty 1

## 2021-11-16 MED ORDER — ACETAMINOPHEN 325 MG PO TABS: 650.0000 mg | ORAL_TABLET | ORAL | Status: DC | PRN

## 2021-11-16 MED ORDER — COCONUT OIL OIL: 1.0000 "application " | TOPICAL_OIL | Status: DC | PRN

## 2021-11-16 MED ORDER — ONDANSETRON HCL 4 MG/2ML IJ SOLN: 4.0000 mg | INTRAMUSCULAR | Status: DC | PRN

## 2021-11-16 MED ORDER — FUROSEMIDE 20 MG PO TABS
20.0000 mg | ORAL_TABLET | Freq: Every day | ORAL | Status: DC
  Administered 2021-11-17 – 2021-11-18 (×2): 20 mg via ORAL
  Filled 2021-11-16 (×2): qty 1

## 2021-11-16 MED ORDER — ZOLPIDEM TARTRATE 5 MG PO TABS: 5.0000 mg | ORAL_TABLET | Freq: Every evening | ORAL | Status: DC | PRN

## 2021-11-16 MED ORDER — METHYLERGONOVINE MALEATE 0.2 MG PO TABS: 0.2000 mg | ORAL_TABLET | ORAL | Status: DC | PRN

## 2021-11-16 MED ORDER — DIBUCAINE (PERIANAL) 1 % EX OINT: 1.0000 "application " | TOPICAL_OINTMENT | CUTANEOUS | Status: DC | PRN

## 2021-11-16 MED ORDER — ONDANSETRON HCL 4 MG/2ML IJ SOLN: 4.0000 mg | Freq: Four times a day (QID) | INTRAMUSCULAR | Status: DC | PRN

## 2021-11-16 MED ORDER — EPHEDRINE 5 MG/ML INJ: 10.0000 mg | INTRAVENOUS | Status: DC | PRN

## 2021-11-16 MED ORDER — SOD CITRATE-CITRIC ACID 500-334 MG/5ML PO SOLN: 30.0000 mL | ORAL | Status: DC | PRN

## 2021-11-16 MED ORDER — FLEET ENEMA 7-19 GM/118ML RE ENEM: 1.0000 | ENEMA | RECTAL | Status: DC | PRN

## 2021-11-16 MED ORDER — ONDANSETRON HCL 4 MG PO TABS: 4.0000 mg | ORAL_TABLET | ORAL | Status: DC | PRN

## 2021-11-16 MED ORDER — METHYLERGONOVINE MALEATE 0.2 MG/ML IJ SOLN: 0.2000 mg | INTRAMUSCULAR | Status: DC | PRN

## 2021-11-16 MED ORDER — BENZOCAINE-MENTHOL 20-0.5 % EX AERO: 1.0000 "application " | INHALATION_SPRAY | CUTANEOUS | Status: DC | PRN

## 2021-11-16 MED ORDER — OXYCODONE-ACETAMINOPHEN 5-325 MG PO TABS: 2.0000 | ORAL_TABLET | ORAL | Status: DC | PRN

## 2021-11-16 MED ORDER — LIDOCAINE HCL (PF) 1 % IJ SOLN
INTRAMUSCULAR | Status: DC | PRN
Start: 2021-11-16 — End: 2021-11-16
  Administered 2021-11-16: 10 mL via EPIDURAL

## 2021-11-16 MED ORDER — TETANUS-DIPHTH-ACELL PERTUSSIS 5-2.5-18.5 LF-MCG/0.5 IM SUSY
0.5000 mL | PREFILLED_SYRINGE | Freq: Once | INTRAMUSCULAR | Status: AC
  Administered 2021-11-17: 0.5 mL via INTRAMUSCULAR
  Filled 2021-11-16: qty 0.5

## 2021-11-16 MED ORDER — LACTATED RINGERS IV SOLN: 500.0000 mL | INTRAVENOUS | Status: DC | PRN

## 2021-11-16 NOTE — Progress Notes (Signed)
Patient Vitals for the past 4 hrs: ? BP Pulse Resp  ?11/16/21 1505 130/79 85 18  ?11/16/21 1500 128/73 72 18  ?11/16/21 1455 127/79 79 18  ?11/16/21 1453 131/76 84 18  ?11/16/21 1450 131/76 84 18  ?11/16/21 1448 124/67 73 18  ?11/16/21 1447 -- -- 20  ?11/16/21 1442 134/79 89 20  ?11/16/21 1436 (!) 148/72 78 20  ?11/16/21 1431 138/87 80 20  ?11/16/21 1425 135/73 64 20  ?11/16/21 1402 (!) 146/77 61 20  ?11/16/21 1334 139/69 64 20  ?11/16/21 1302 138/81 68 18  ?11/16/21 1232 132/81 74 18  ?11/16/21 1213 129/86 75 20  ?11/16/21 1200 (!) 138/97 77 20  ? ?Comfortable w/epidural. Blood sugars normal. Meets criteria for GHTN.  No real change in cx. IUPC placed, MVUs ~ 120., PItocin at 8 mu/min. WIll continue to titrate up until adequate, check Pr/Cr ratio; all other PreE labs normal. ?

## 2021-11-16 NOTE — Anesthesia Preprocedure Evaluation (Signed)
Anesthesia Evaluation  ?Patient identified by MRN, date of birth, ID band ?Patient awake ? ? ? ?Reviewed: ?Allergy & Precautions, H&P , NPO status , Patient's Chart, lab work & pertinent test results ? ?Airway ?Mallampati: II ? ?TM Distance: >3 FB ?Neck ROM: full ? ? ? Dental ?no notable dental hx. ? ?  ?Pulmonary ?neg pulmonary ROS,  ?  ?Pulmonary exam normal ? ? ? ? ? ? ? Cardiovascular ?negative cardio ROS ?Normal cardiovascular exam ? ? ?  ?Neuro/Psych ?negative neurological ROS ? negative psych ROS  ? GI/Hepatic ?negative GI ROS, Neg liver ROS,   ?Endo/Other  ?diabetes, GestationalMorbid obesity ? Renal/GU ?negative Renal ROS  ?negative genitourinary ?  ?Musculoskeletal ?negative musculoskeletal ROS ?(+)  ? Abdominal ?(+) + obese,   ?Peds ? Hematology ?negative hematology ROS ?(+)   ?Anesthesia Other Findings ? ? Reproductive/Obstetrics ?(+) Pregnancy ? ?  ? ? ? ? ? ? ? ? ? ? ? ? ? ?  ?  ? ? ? ? ? ? ? ? ?Anesthesia Physical ?Anesthesia Plan ? ?ASA: 3 ? ?Anesthesia Plan: Epidural  ? ?Post-op Pain Management:   ? ?Induction:  ? ?PONV Risk Score and Plan:  ? ?Airway Management Planned:  ? ?Additional Equipment:  ? ?Intra-op Plan:  ? ?Post-operative Plan:  ? ?Informed Consent: I have reviewed the patients History and Physical, chart, labs and discussed the procedure including the risks, benefits and alternatives for the proposed anesthesia with the patient or authorized representative who has indicated his/her understanding and acceptance.  ? ? ? ? ? ?Plan Discussed with:  ? ?Anesthesia Plan Comments:   ? ? ? ? ? ? ?Anesthesia Quick Evaluation ? ?

## 2021-11-16 NOTE — Lactation Note (Signed)
This note was copied from a baby's chart. ?Lactation Consultation Note ? ?Patient Name: Katherine Alvarez ?Today's Date: 11/16/2021 ?Reason for consult: L&D Initial assessment;Term ?Age:35 hours ? ?L&D consult with <60 minutes old infant and P3 mother. Congratulated family on newborn.  ?Assisted with hand expression  per mother's request and LC spoonfed ~44mL. ?Discussed STS as ideal transition for infants after birth. Talked about primal reflexes. Explained LC services availability during postpartum stay. Thanked family for their time.   ?  ? ?Maternal Data ?Has patient been taught Hand Expression?: Yes ?Does the patient have breastfeeding experience prior to this delivery?: Yes ?How long did the patient breastfeed?: 8 months and 6 months combo feeding ? ?Feeding ?Mother's Current Feeding Choice: Breast Milk and Formula ? ?LATCH Score ?No latch attempted, mother requested baby to be swaddled.  ? ?Interventions ?Interventions: Hand express;Breast massage;Expressed milk;Education ? ?Discharge ?Pump: Personal ?WIC Program: Yes ? ?Consult Status ?Consult Status: Follow-up from L&D ?Date: 11/16/21 ?Follow-up type: In-patient ? ? ? ?Cathy Crounse A Higuera Ancidey ?11/16/2021, 9:20 PM ? ? ? ?

## 2021-11-16 NOTE — Discharge Summary (Signed)
? ?  Postpartum Discharge Summary ? ? ? ?   ?Patient Name: Katherine Alvarez ?DOB: 1986/02/04 ?MRN: [id number] ? ?Date of admission: 11/16/2021 ?Delivery date:11/16/2021  ?Delivering provider: Christin Fudge  ?Date of discharge: 11/18/2021 ? ?Admitting diagnosis: Indication for care in labor or delivery [O75.9] ?Intrauterine pregnancy: [redacted]w[redacted]d     ?Secondary diagnosis:  Principal Problem: ?  Indication for care in labor or delivery ?Active Problems: ?  Supervision of other normal pregnancy, antepartum ?  History of gestational diabetes in prior pregnancy, currently pregnant ?  Language barrier affecting health care ?  Gestational diabetes mellitus (GDM), antepartum ?  Gestational hypertension ? ?Additional problems: A2DM    ?Discharge diagnosis: Term Pregnancy Delivered, Gestational Hypertension, and GDM A2                                              ?Post partum procedures: NA ?Augmentation: AROM and Pitocin ?Complications: None ? ?Hospital course: Induction of Labor With Vaginal Delivery   ?36 y.o. yo [id number] at [redacted]w[redacted]d was admitted to the hospital 11/16/2021 for induction of labor.  Indication for induction: A2 DM.  Patient had an uncomplicated labor course as follows: ?Membrane Rupture Time/Date: 11:08 AM ,11/16/2021   ?Delivery Method:Vaginal, Spontaneous  ?Episiotomy: None  ?Lacerations:  None  ?Details of delivery can be found in separate delivery note.  Patient had a routine postpartum course. She was started on a 5 day course of Lasix for her gHTN. She was also started on a 6 week course of Procardia $RemoveBefo'30mg'yYZtKkiKIxH$  since had gHTN and more than 3 BP readings >130s or >80s.Her lasix was dropped off in her hospital room through Meds to Bed on day prior to discharge. Her Procardia wasn't started until day of discharge and therefore was sent to her pharmacy. Patient was counseled to pick up meds at her pharmacy using an [employer] interpreter. Patient is discharged home 11/18/21. ? ?Newborn Data: ?Birth  date:11/16/2021  ?Birth time:8:30 PM  ?Gender:Female  ?Living status:Living  ?Apgars:9 ,9  ?Weight:3730 g  ? ?Magnesium Sulfate received: No ?BMZ received: No ?Rhophylac:N/A ?MMR:N/A ?T-DaP:Given prenatally ?Flu: Yes ?Transfusion:No ? ?Physical exam  ?Vitals:  ? 11/18/21 0107 11/18/21 0154 11/18/21 0509 11/18/21 0845  ?BP: 139/78 110/69 126/81 137/67  ?Pulse: 90 79 77 71  ?Resp: $Remov'16 17 18 16  'mlhAPj$ ?Temp: 98 ?F (36.7 ?C)  98 ?F (36.7 ?C) 98 ?F (36.7 ?C)  ?TempSrc: Oral  Oral Oral  ?SpO2: 98% 98% 98% 99%  ?Weight:      ?Height:      ? ?General: alert, cooperative, and no distress ?Lochia: appropriate ?Uterine Fundus: firm ?Incision: N/A ?DVT Evaluation: No evidence of DVT seen on physical exam. ?Labs: ?Lab Results  ?Component Value Date  ? WBC 9.7 11/16/2021  ? HGB 12.5 11/16/2021  ? HCT 36.9 11/16/2021  ? MCV 90.0 11/16/2021  ? PLT 163 11/16/2021  ? ?CMP Latest Ref Rng & Units 05/02/2021  ?Glucose 65 - 99 mg/dL 80  ?BUN 6 - 20 mg/dL 6  ?Creatinine 0.57 - 1.00 mg/dL 0.44(L)  ?Sodium 134 - 144 mmol/L 137  ?Potassium 3.5 - 5.2 mmol/L 4.2  ?Chloride 96 - 106 mmol/L 100  ?CO2 20 - 29 mmol/L 20  ?Calcium 8.7 - 10.2 mg/dL 9.6  ?Total Protein 6.0 - 8.5 g/dL 7.0  ?Total Bilirubin 0.0 - 1.2 mg/dL 0.3  ?Alkaline Phos  44 - 121 IU/L 53  ?AST 0 - 40 IU/L 13  ?ALT 0 - 32 IU/L 12  ? ?Edinburgh Score: ?Edinburgh Postnatal Depression Scale Screening Tool 11/17/2021  ?I have been able to laugh and see the funny side of things. 0  ?I have looked forward with enjoyment to things. 0  ?I have blamed myself unnecessarily when things went wrong. 0  ?I have been anxious or worried for no good reason. 0  ?I have felt scared or panicky for no good reason. 0  ?Things have been getting on top of me. 3  ?I have been so unhappy that I have had difficulty sleeping. 3  ?I have felt sad or miserable. 0  ?I have been so unhappy that I have been crying. 0  ?The thought of harming myself has occurred to me. 0  ?Edinburgh Postnatal Depression Scale Total 6   ? ? ? ?After visit meds:  ?Allergies as of 11/18/2021   ?No Known Allergies ?  ? ?  ?Medication List  ?  ? ?STOP taking these medications   ? ?Accu-Chek Guide test strip ?Generic drug: glucose blood ?  ?Accu-Chek Guide w/Device Kit ?  ?Accu-Chek Softclix Lancets lancets ?  ?aspirin EC 81 MG tablet ?  ?Blood Pressure Kit Devi ?  ?Gojji Weight Scale Misc ?  ?metFORMIN 500 MG tablet ?Commonly known as: GLUCOPHAGE ?  ?Prenate Mini 18-0.6-0.4-350 MG Caps ?  ? ?  ? ?TAKE these medications   ? ?acetaminophen 325 MG tablet ?Commonly known as: Tylenol ?Take 2 tablets (650 mg total) by mouth every 4 (four) hours as needed (for pain scale < 4). ?  ?furosemide 20 MG tablet ?Commonly known as: LASIX ?Tome 1 tableta (20 mg en total) por v?a oral diariamente. ?(Take 1 tablet (20 mg total) by mouth daily.) ?  ?ibuprofen 600 MG tablet ?Commonly known as: ADVIL ?Take 1 tablet (600 mg total) by mouth every 6 (six) hours. ?  ?influenza vac split quadrivalent PF 0.5 ML injection ?Commonly known as: FLUARIX ?Inject 0.5 mLs into the muscle tomorrow at 10 am for 1 dose. ?  ?NIFEdipine 30 MG 24 hr tablet ?Commonly known as: ADALAT CC ?Take 1 tablet (30 mg total) by mouth daily. ?  ?Prenatal 27-1 MG Tabs ?Take 1 tablet by mouth daily at 12 noon. ?  ? ?  ? ?ASK your doctor about these medications   ? ?measles, mumps & rubella vaccine injection ?Commonly known as: MMR ?Inject 0.5 mLs into the skin once for 1 dose. ?Ask about: Should I take this medication? ?  ? ?  ? ? ? ? ?Discharge home in stable condition ?Infant Feeding: Bottle ?Infant Disposition:home with mother ?Discharge instruction: per After Visit Summary and Postpartum booklet. ?Activity: Advance as tolerated. Pelvic rest for 6 weeks.  ?Diet: routine diet ?Future Appointments: ?Future Appointments  ?Date Time Provider [place]  ?11/24/2021 10:00 AM [facility] NURSE [facility] None  ?12/19/2021 10:55 AM Wood[place] Mode, MD [facility] None  ? ?Follow up Visit: ? ? ?Please schedule this  patient for a In person postpartum visit in 4 weeks with the following provider: Any provider. ?Additional Postpartum F/U:BP check 1 week and 6 wk GTT ?Low risk pregnancy complicated by: GDM and HTN ?Delivery mode:  Vaginal, Spontaneous  ?Anticipated Birth Control:  Plans Interval BTL ? ? ?Renard Matter, MD, MPH ?OB Fellow, Faculty [facility] ? ? ? ?

## 2021-11-16 NOTE — Anesthesia Procedure Notes (Signed)
Epidural ?Patient location during procedure: OB ?Start time: 11/16/2021 2:27 PM ?End time: 11/16/2021 2:35 PM ? ?Staffing ?Anesthesiologist: Leilani Able, MD ?Performed: anesthesiologist  ? ?Preanesthetic Checklist ?Completed: patient identified, IV checked, site marked, risks and benefits discussed, surgical consent, monitors and equipment checked, pre-op evaluation and timeout performed ? ?Epidural ?Patient position: sitting ?Prep: DuraPrep and site prepped and draped ?Patient monitoring: continuous pulse ox and blood pressure ?Approach: midline ?Location: L3-L4 ?Injection technique: LOR air ? ?Needle:  ?Needle type: Tuohy  ?Needle gauge: 17 G ?Needle length: 9 cm and 9 ?Needle insertion depth: 7 cm ?Catheter type: closed end flexible ?Catheter size: 19 Gauge ?Catheter at skin depth: 12 cm ?Test dose: negative and Other ? ?Assessment ?Events: blood not aspirated, injection not painful, no injection resistance, no paresthesia and negative IV test ? ?Additional Notes ?Reason for block:procedure for pain ? ? ? ?

## 2021-11-16 NOTE — H&P (Signed)
?Katherine Alvarez is a 36 y.o. female [phone] with IUP at [redacted]w[redacted]d presenting for IOL for A2DM, on metformin $RemoveBefo'500mg'oijIBjTsokq$  BOD. [facility] ? 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 ?Hx GDM ? ? ? ?Past Medical History: ?Past Medical History:  ?Diagnosis Date  ? Gestational diabetes   ? ? ?Past Surgical History: ?Past Surgical History:  ?Procedure Laterality Date  ? CERVICAL CERCLAGE    ? ? ?Obstetrical History: ?OB History   ? ? Gravida  ?4  ? Para  ?2  ? Term  ?2  ? Preterm  ?   ? AB  ?1  ? Living  ?2  ?  ? ? SAB  ?1  ? IAB  ?   ? Ectopic  ?   ? Multiple  ?   ? Live Births  ?2  ?   ?  ?  ? ? ?Social History: ?Social History  ? ?Socioeconomic History  ? Marital status: Single  ?  Spouse name: Not on file  ? Number of children: Not on file  ? Years of education: Not on file  ? Highest education level: Not on file  ?Occupational History  ? Occupation: homemaker  ?Tobacco Use  ? Smoking status: Never  ? Smokeless tobacco: Never  ?Vaping Use  ? Vaping Use: Never used  ?Substance and Sexual Activity  ? Alcohol use: Not Currently  ? Drug use: Never  ? Sexual activity: Yes  ?Other Topics Concern  ? Not on file  ?Social History Narrative  ? Not on file  ? ?Social Determinants of Health  ? ?Financial Resource Strain: Not on file  ?Food Insecurity: Not on file  ?Transportation Needs: Not on file  ?Physical Activity: Not on file  ?Stress: Not on file  ?Social Connections: Not on file  ? ? ?Family History: ?Family History  ?Problem Relation Age of Onset  ? Cancer Neg Hx   ? Diabetes Neg Hx   ? Heart disease Neg Hx   ? ? ?Allergies: ?No Known Allergies ? ?Medications Prior to Admission  ?Medication Sig Dispense Refill Last Dose  ? aspirin EC 81 MG tablet Take 1 tablet (81 mg total) by mouth daily. Take after 12 weeks for prevention of preeclampsia later in pregnancy 300 tablet 2 11/15/2021  ? metFORMIN  (GLUCOPHAGE) 500 MG tablet Take 2 tablets (1,000 mg total) by mouth at bedtime. 8 PM 60 tablet 5 11/15/2021  ? Prenat-FeCbn-FeAsp-Meth-FA-DHA (PRENATE MINI) 18-0.6-0.4-350 MG CAPS Take 1 tablet by mouth daily at 8 pm. 30 capsule 12 11/15/2021  ? Accu-Chek Softclix Lancets lancets Use as instructed 100 each 12   ? Blood Glucose Monitoring Suppl (ACCU-CHEK GUIDE) w/Device KIT 1 Device by Does not apply route 4 (four) times daily. 1 kit 0   ? Blood Pressure Monitoring (BLOOD PRESSURE KIT) DEVI 1 kit by Does not apply route once a week. Check Blood Pressure regularly and record readings into the Babyscripts App.  Large Cuff.  DX O90.0 1 each 0   ? glucose blood (ACCU-CHEK GUIDE) test strip Use to check blood sugars four times a day was instructed 50 each 12   ? Misc. Devices (GOJJI WEIGHT SCALE) MISC 1 Device by Does not apply route once a week. 1 each 0   ? ? ? ? ? ? ?Review of Systems  ? ?Constitutional: Negative for fever and chills ?Eyes: Negative for visual disturbances ?Respiratory: Negative for shortness  of breath, dyspnea ?Cardiovascular: Negative for chest pain or palpitations  ?Gastrointestinal: Negative for abdominal pain, vomiting, diarrhea and constipation.   ?Genitourinary: Negative for dysuria and urgency ?Musculoskeletal: Negative for back pain, joint pain, myalgias  ?Neurological: Negative for dizziness and headaches ? ? ? ? ? ?Blood pressure (!) 152/92, pulse 89, resp. rate 20, height $RemoveBe'5\' 5"'NqwTUSeBe$  (1.651 m), weight 120.3 kg, last menstrual period 02/08/2021, not currently breastfeeding. ?General appearance: alert, cooperative, and no distress ?Lungs: normal respiratory effort ?Heart: regular rate and rhythm ?Abdomen: soft, non-tender; bowel sounds normal ?Extremities: Homans sign is negative, no sign of DVT ?DTR's 2+ ?Presentation: cephalic ?Fetal monitoring  Baseline: 135 bpm, Variability: Good {> 6 bpm), Accelerations: Reactive, and Decelerations: Absent ?Uterine activity  None  ?Dilation: 3 ?Exam by:: F.  Cresenzo-Dishmon, CNM ? ?EFW 58% @ 36 weeks ?Prenatal labs: ?ABO, Rh: --/--/PENDING (03/02 1055) ?Antibody: PENDING (03/02 1055) ?Rubella: immune ?RPR: Non Reactive (12/16 0927)  ?HBsAg: Negative (08/16 1306)  ?HIV: Non Reactive (12/16 0927)  ?GBS: Negative/-- (02/15 1412)  ? ?Nursing Staff Provider  ?Office Location  [place] Dating  U/S 04/10/21  EDD: 11/23/21  ?Language   SPANISH Anatomy US    ?Flu Vaccine  05/30/21 Genetic/Carrier Screen  NIPS:    ?AFP:   WNL ?[place]:  ?TDaP Vaccine   09/01/21 Hgb A1C or  ?GTT Early  ?Third trimester   ?COVID Vaccine  YES   LAB RESULTS   ?Rhogam  N/A Blood Type O/Positive/-- (08/16 1306)   ?Baby Feeding Plan  BREAST & BOTTLE Antibody Negative (08/16 1306)  ?Contraception  IUD Rubella 4.19 (08/16 1306)  ?Circumcision  NO RPR Non Reactive (12/16 0927)   ?Pediatrician    HBsAg Negative (08/16 1306)   ?Support Person  FOB HCVAb negative  ?Prenatal Classes  HIV Non Reactive (12/16 5625)     ?BTL Consent  GBS   ?(For PCN allergy, check sensitivities)   ?VBAC Consent  Pap  05/02/21  ?     ?DME Rx [*] BP cuff ?[*] Weight Scale Waterbirth  $RemoveBef'[ ]'mOnFoSmwkx$  Class $Remo'[ ]'Yxitv$  Consent $Remove'[ ]'AbpSPUs$  CNM visit  ?PHQ9 & GAD7 [* ] new OB ?[  ] 28 weeks  ?[ X] 36 weeks Induction  $RemoveBe'[ ]'gGzAwAxRJ$  Orders Entered $RemoveBefore'[ ]'IRLmqCLMXOBkk$ Foley Y/N  ? ?Prenatal Transfer Tool  ?Maternal Diabetes: Yes:  Diabetes Type:  Insulin/Medication controlled ?Genetic Screening: Normal ?Maternal Ultrasounds/Referrals: Normal ?Fetal Ultrasounds or other Referrals:  None ?Maternal Substance Abuse:  No ?Significant Maternal Medications:  None ?Significant Maternal Lab Results: Group B Strep negative ? ? ? ?Results for orders placed or performed during the hospital encounter of 11/16/21 (from the past 24 hour(s))  ?Glucose, capillary  ? Collection Time: 11/16/21 10:48 AM  ?Result Value Ref Range  ? Glucose-Capillary 97 70 - 99 mg/dL  ? Comment 1 Document in Chart   ?Type and screen [place]  ? Collection Time: 11/16/21 10:55 AM  ?Result Value Ref Range  ? ABO/RH(D)  PENDING   ? Antibody Screen PENDING   ? Sample Expiration    ?  11/19/2021,2359 ?Performed at [facility], [place] [address], [place], [place] [address] ?  ?CBC  ? Collection Time: 11/16/21 10:56 AM  ?Result Value Ref Range  ? WBC 9.7 4.0 - 10.5 K/uL  ? RBC 4.10 3.87 - 5.11 MIL/uL  ? Hemoglobin 12.5 12.0 - 15.0 g/dL  ? HCT 36.9 36.0 - 46.0 %  ? MCV 90.0 80.0 - 100.0 fL  ? MCH 30.5 26.0 -  34.0 pg  ? MCHC 33.9 30.0 - 36.0 g/dL  ? RDW 14.1 11.5 - 15.5 %  ? Platelets 163 150 - 400 K/uL  ? nRBC 0.0 0.0 - 0.2 %  ? ? ?Assessment: ?Valeta Paz Jesus Rosea Dory is a 36 y.o. [phone] with an IUP at [redacted]w[redacted]d presenting for IOL for A2DM. ? ?Plan: ?#Labor: AROM w/clear fluid.  Start Pitocin ?#Pain:  Per request ?#FWB Cat 1 ?#ID: GBS: neg  ?#MOF:  both ?#MOC: outpt IUD ?#Circ: no ? ? ?Joaquim Lai Cresenzo-Dishmon ?11/16/2021, 11:29 AM ? ? ?

## 2021-11-16 NOTE — Progress Notes (Signed)
Labor Progress Note ?Katherine Alvarez is a 36 y.o. [id number] at [redacted]w[redacted]d presented for IOL for A2DM ? ?S:  ?The patient continues to experience contractions and is doing well. ? ?O:  ?BP (!) 149/101   Pulse 67   Temp 98.3 ?F (36.8 ?C) (Oral)   Resp 16   Ht 5\' 5"  (1.651 m)   Wt 120.3 kg   LMP 02/08/2021 (Approximate)   BMI 44.13 kg/m?  ? ?Vitals:  ? 11/16/21 1453 11/16/21 1632 11/16/21 1802 11/16/21 1832 11/16/21 1905  ?BP: 131/76 136/84 137/83 (!) 149/101 120/71  ?Pulse: 84 74 72 67 73  ?Resp:    16 16  ? ?EFM: baseline 140, variability: 10/-accels/+early decels ?CVE: Dilation: 10 ?Dilation Complete Date: 11/16/21 ?Dilation Complete Time: 1819 ?Effacement (%): 70 ?Station: -2 ?Presentation: Vertex ?Exam by:: fran cres dishmon, cnm ? ?Protein / creatinine ratio, urine     Status: Abnormal  ? Collection Time: 11/16/21  3:45 PM  ?Result Value Ref Range  ? Creatinine, Urine 20.46 mg/dL  ? Total Protein, Urine 6 mg/dL  ? Protein Creatinine Ratio 0.29 (H) 0.00 - 0.15 mg/mg[Cre]  ? ?A&P: 36 y.o. 20 [redacted]w[redacted]d IOL for A2DM ?#Labor: Progressing well. Awaiting imminent active labor. CX 2-3 minutes apart, unchanged from before. Failed to acquire fetal HR with internal catheter d/t technical issues, continuing to use external catheter. Patient placed in lateral decubitus position to assist with labor. ?#Pain: Epidural administered, patient comfortable ?#FWB: Cat 1 ?#GBS negative ? ?#gHTN ?Along with multiple hypertensive blood pressures more than 4 hours apart, patient has elevated protein creatinine ratio. However, Pr/Cr ratio remains under 0.3 and is not consistent with a diagnosis of PreE. No evidence of severe features. ? ?#A2DM ?Spot blood glucose checks are within normal limits. Continue to monitor. ? ?[redacted]w[redacted]d, Medical Student ?7:05 PM  ?

## 2021-11-17 ENCOUNTER — Other Ambulatory Visit (HOSPITAL_COMMUNITY): Payer: Self-pay

## 2021-11-17 MED ORDER — PRENATAL 27-1 MG PO TABS
1.0000 | ORAL_TABLET | Freq: Every day | ORAL | 1 refills | Status: AC
  Filled 2021-11-17: qty 30, 30d supply, fill #0

## 2021-11-17 MED ORDER — FUROSEMIDE 20 MG PO TABS
20.0000 mg | ORAL_TABLET | Freq: Every day | ORAL | 0 refills | Status: AC
Start: 2021-11-17 — End: ?
  Filled 2021-11-17: qty 4, 4d supply, fill #0

## 2021-11-17 MED ORDER — MEASLES, MUMPS & RUBELLA VAC IJ SOLR
0.5000 mL | Freq: Once | INTRAMUSCULAR | 0 refills | Status: AC
  Filled 2021-11-17: qty 1, 1d supply, fill #0

## 2021-11-17 MED ORDER — ACETAMINOPHEN 325 MG PO TABS
650.0000 mg | ORAL_TABLET | ORAL | 1 refills | Status: AC | PRN
  Filled 2021-11-17: qty 30, 3d supply, fill #0

## 2021-11-17 MED ORDER — IBUPROFEN 600 MG PO TABS
600.0000 mg | ORAL_TABLET | Freq: Four times a day (QID) | ORAL | 0 refills | Status: AC
  Filled 2021-11-17: qty 30, 8d supply, fill #0

## 2021-11-17 MED ORDER — INFLUENZA VAC SPLIT QUAD 0.5 ML IM SUSY
0.5000 mL | PREFILLED_SYRINGE | INTRAMUSCULAR | Status: AC
  Administered 2021-11-17: 0.5 mL via INTRAMUSCULAR
  Filled 2021-11-17: qty 0.5

## 2021-11-17 MED ORDER — INFLUENZA VAC SPLIT QUAD 0.5 ML IM SUSY
0.5000 mL | PREFILLED_SYRINGE | INTRAMUSCULAR | 0 refills | Status: AC
  Filled 2021-11-17: qty 0.5, 1d supply, fill #0

## 2021-11-17 NOTE — Progress Notes (Signed)
Post Partum Day 1 ?Subjective: ?Pain controlled with po pain medication and topical perineal preparations. She denies severe pain, heavy bleeding, palpitations, or sob. Ambulating and voiding without difficulty. Method of Feeding: breast /formula. Breastfeeding is going well.   ? ?Objective: ?Blood pressure 116/73, pulse 80, temperature 98.7 ?F (37.1 ?C), temperature source Oral, resp. rate 16, height 5\' 5"  (1.651 m), weight 120.3 kg, last menstrual period 02/08/2021, SpO2 97 %, unknown if currently breastfeeding. ? ?Physical Exam:  ?General: alert and cooperative ?Lochia: appropriate ?Uterine Fundus: firm ?Incision: no incision, perineum intact  ?DVT Evaluation: No evidence of DVT seen on physical exam. ?No cords or calf tenderness. ?No significant calf/ankle edema. ? ?Recent Labs  ?  11/16/21 ?1056  ?HGB 12.5  ?HCT 36.9  ? ? ?Assessment/Plan: ?36 yo G4P3 postpartum day 1 status post vaginal delivery. Pregnancy course was complicated by [id number], new GHTN ?Plan for discharge tomorrow ? ? ? LOS: 1 day  ? ?Shella Maxim, SNM ?11/17/2021, 7:47 AM  ? ? ?

## 2021-11-17 NOTE — Lactation Note (Signed)
This note was copied from a baby's chart. ?Lactation Consultation Note ? ?Patient Name: Katherine Alvarez ?Today's Date: 11/17/2021 ?Reason for consult: Initial assessment;Other (Comment) Garment/textile technologist # [phone] Jamesetta [place] . per mom last fed at 5:31 pm and baby took 10 ml . baby sound asleep. per mom BF 2 others and feels comfortable w/ hand expressing. LC reviewed supply + demand / importance of giving baby practice latching 1st, before bottle.) ?Age:40 hours ? ?Maternal Data ?Has patient been taught Hand Expression?:  (per mom feels comfortable with hand expressing) ? ?Feeding ?Mother's Current Feeding Choice: Breast Milk and Formula ?Nipple Type: Slow - flow ? ?LATCH Score ?  ? ?  ? ?  ? ?  ? ?  ? ?  ? ? ?Lactation Tools Discussed/Used ?  ? ?Interventions ?  ? ?Discharge ?Pump: Personal;DEBP ? ?Consult Status ?Consult Status: Follow-up ?Date: 11/17/21 ?Follow-up type: In-patient ? ? ? ?Matilde Sprang [name] ?11/17/2021, 7:01 PM ? ? ? ?

## 2021-11-17 NOTE — Progress Notes (Signed)
[employer] interpreter used upon getting report to tell the patient the plan of care and to tell her about emergency bell, pain medicine that she can have. Baby safety taught due to infant having a fuzzy blanket in crib. All questions answered patient had no complaints except for pain medicine and I gave that to her and told her she could have it just to ask. MD updated on Patients Blood pressure this am. Lasix given. ?

## 2021-11-17 NOTE — Lactation Note (Signed)
This note was copied from a baby's chart. ?Lactation Consultation Note ? ?Patient Name: Katherine Alvarez ?Today's Date: 11/17/2021 ?  ?Age:35 hours ? ?[place] visit attempted, but Mom had multiple visitors in room. ? ? ?Katherine Alvarez ?11/17/2021, 1:29 PM ? ? ? ?

## 2021-11-17 NOTE — Progress Notes (Addendum)
POSTPARTUM PROGRESS NOTE ? ?Post Partum Day 1 ? ?Subjective: ? ?Katherine Alvarez is a 36 y.o. [phone] s/p SVD w/ IOL for A2DM at [redacted]w[redacted]d.  No acute events overnight.  Pt denies problems with ambulating, voiding or po intake.  She denies nausea or vomiting.  Pain is poorly controlled, patient reports 7/10 pain and stated 10/10 earlier per RN.  She has had flatus. She has not had bowel movement.  Lochia Small.  ? ?Objective: ?Blood pressure 116/67, pulse 77, temperature 98.2 ?F (36.8 ?C), temperature source Oral, resp. rate 18, height 5\' 5"  (1.651 m), weight 120.3 kg, last menstrual period 02/08/2021, SpO2 98 %, unknown if currently breastfeeding. ? ?Vitals:  ? 11/16/21 1930 11/16/21 2000 11/16/21 2045 11/16/21 2100  ?BP: 124/62 (!) 144/104 (!) 120/56 (!) 133/96  ? 11/16/21 2115 11/16/21 2130 11/16/21 2145 11/16/21 2200  ?BP: 138/81 114/66 104/74 (!) 119/96  ? 11/16/21 2227 11/16/21 2321 11/17/21 0327 11/17/21 0800  ?BP: 137/88 134/83 116/73 116/67  ?  ? ?Physical Exam:  ?General: Alert, cooperative and no distress ?Chest: Normal work of breathing on room air ?Heart: Regular rate, distal pulses intact ?Abdomen: Soft, nontender ?Uterine Fundus: Firm, appropriately tender ?DVT Evaluation: No calf swelling or tenderness ?Extremities: No peripheral edema, normal capillary refill ?Skin: warm, dry ? ?Recent Labs  ?  11/16/21 ?1056  ?HGB 12.5  ?HCT 36.9  ? ? ?Assessment/Plan: ?Katherine Alvarez is a 36 y.o. [phone] s/p SVD with IOL for A2DM at [redacted]w[redacted]d. ? ?PPD#1 - Doing well ?Contraception: Outpt IUD ?Feeding: Both, no issues with breastfeeding ?Dispo: Plan for discharge is tomorrow following 24 hour period. ? ?#gHTN ?Patient has had 2 separate hypertensive BPs since start of postpartum period after 2200 last night. Continue to monitor and evaluate for further hypertensive measurements, which would be consistent with postpartum HTN and treatment per protocol will be started. ? ? LOS: 1 day  ? ?[place]v,  Dimitry, Medical Student, ?11/17/2021, 11:36 AM   ? ? ?Attestation of Supervision of Student:  I confirm that I have verified the information documented in the medical student?s note and that I have also personally reperformed the history, physical exam and all medical decision making activities.  I have verified that all services and findings are accurately documented in this student's note; and I agree with management and plan as outlined in the documentation. I have also made any necessary editorial changes. ? ?-Patient is asymptomatic for pre-e at this time ?-Patient understands need for 4 days of lasix postpartum ?-patient will keep BP check next week ?-Message sent to clinic to add 2 hour GTT postpartum for her April 4 PP visit ?-Patient reports that her pain is a 0/10 at this time ? ?Starr Lake, CNM ?[facility] ?11/17/2021 12:32 PM ? ?

## 2021-11-17 NOTE — Progress Notes (Signed)
Patient had no complaints. All questions answered. ?

## 2021-11-17 NOTE — Anesthesia Postprocedure Evaluation (Signed)
Anesthesia Post Note ? ?Patient: Katherine Alvarez [name] ? ?Procedure(s) Performed: AN AD HOC LABOR EPIDURAL ? ?  ? ?Patient location during evaluation: Mother Baby ?Anesthesia Type: Epidural ?Level of consciousness: awake and alert ?Pain management: pain level controlled ?Vital Signs Assessment: post-procedure vital signs reviewed and stable ?Respiratory status: spontaneous breathing, nonlabored ventilation and respiratory function stable ?Cardiovascular status: stable ?Postop Assessment: no headache, no backache and epidural receding ?Anesthetic complications: no ? ? ?No notable events documented. ? ?Last Vitals:  ?Vitals:  ? 11/16/21 2321 11/17/21 0327  ?BP: 134/83 116/73  ?Pulse: 96 80  ?Resp: 18 16  ?Temp: 36.9 ?C 37.1 ?C  ?SpO2: 98% 97%  ?  ?Last Pain:  ?Vitals:  ? 11/17/21 0327  ?TempSrc: Oral  ?PainSc:   ? ?Pain Goal:   ? ?  ?  ?  ?  ?  ?  ?  ? ?Bethzaida Boord ? ? ? ? ?

## 2021-11-18 LAB — GLUCOSE, CAPILLARY: Glucose-Capillary: 105 mg/dL — ABNORMAL HIGH (ref 70–99)

## 2021-11-18 MED ORDER — NIFEDIPINE ER 30 MG PO TB24: 30.0000 mg | ORAL_TABLET | Freq: Every day | ORAL | 0 refills | Status: AC

## 2021-11-18 MED ORDER — NIFEDIPINE ER OSMOTIC RELEASE 30 MG PO TB24
30.0000 mg | ORAL_TABLET | Freq: Every day | ORAL | Status: DC
  Administered 2021-11-18: 30 mg via ORAL
  Filled 2021-11-18: qty 1

## 2021-11-18 NOTE — Progress Notes (Signed)
Babyscripts order initiated per MD order. Patient is not receiving email in order to initiate app on her phone. Placed order again with verified email address just in case address previously entered incorrectly. Provided patient with blood pressure cuff. Called Dr. Gwenlyn Perking informing her that patient not receiving email and unable to start app. Dr. Gwenlyn Perking stated to instruct patient to come into office in 1 week for blood pressure check. Encouraged patient to check blood pressures daily and to call MD or come into maternity assessment unit if her blood pressures persistently >140/90 and/or having symptoms of preeclampsia. To discuss with patient using interpreter during discharge education. Also will give patient instructions on how to use blood pressure cuff with interpreter during discharge education. Lucia Bitter [place] ? ?

## 2021-11-18 NOTE — Progress Notes (Addendum)
Post Partum Day 2 ?Subjective: ?Katherine Alvarez is doing well. Pain is well controlled with po pain medication and topical perineal preparations. She denies severe pain, heavy bleeding, palpitations, or sob. Ambulating and voiding without difficulty. Method of Feeding: breast /formula. Breastfeeding is going well.   ? ?Objective: ?Blood pressure 126/81, pulse 77, temperature 98 ?F (36.7 ?C), temperature source Oral, resp. rate 18, height 5\' 5"  (1.651 m), weight 120.3 kg, last menstrual period 02/08/2021, SpO2 98 %, unknown if currently breastfeeding. ? ?Physical Exam:  ?General: alert and cooperative ?Lochia: appropriate ?Uterine Fundus: firm; 2 below U  ?Incision: n/a ?DVT Evaluation: No cords or calf tenderness. ?No significant calf/ankle edema. ? ?Recent Labs  ?  11/16/21 ?1056  ?HGB 12.5  ?HCT 36.9  ? ? ?Assessment/Plan: ?36 yo G4P3 s/p SVD at 39w with routine uncomplicated postpartum course. Pregnancy course was complicated by [id number], new GHTN.  ?Discharge home and Contraception to be determined by patient at initial PP visit ?Continue lasix for prescribed course ?Start procardia in hospital and continue for 6 weeks. Necessity to be reevaluated at that time based on BP and symptomology.  ?Reviewed postpartum depression s/s to report. Advised going to ED or calling 911 with SI/HI. Encouraged continuing PNV and healthy diet while breastfeeding.  ? ? LOS: 2 days  ? ?Shella Maxim, SNM ?11/18/2021, 7:17 AM  ? ? ?GME ATTESTATION:  ?I saw and evaluated the patient. I agree with the findings and the plan of care as documented in the resident?s note. Patient discharging home. See separate DC summary. ? ?Renard Matter, MD, MPH ?OB Fellow, Faculty Practice ?[place] [facility] ?11/18/2021 9:28 AM ? ? ?

## 2021-11-18 NOTE — Progress Notes (Signed)
Discharge education reviewed with patient using Wallene Huh, in house Spanish interpreter. Patient verbalizes understanding. Maudry Diego [place] ? ?

## 2021-11-24 ENCOUNTER — Ambulatory Visit (INDEPENDENT_AMBULATORY_CARE_PROVIDER_SITE_OTHER): Payer: Medicaid Other

## 2021-11-24 ENCOUNTER — Other Ambulatory Visit: Payer: Self-pay

## 2021-11-24 VITALS — BP 133/88 | HR 90

## 2021-11-24 DIAGNOSIS — Z0130 Encounter for examination of blood pressure without abnormal findings: Secondary | ICD-10-CM

## 2021-11-24 NOTE — Progress Notes (Addendum)
Subjective:  ?Katherine Alvarez is a 36 y.o. female here for BP check.  ? ?Hypertension ROS: taking medications as instructed, no medication side effects noted, no TIA's, no chest pain on exertion, no dyspnea on exertion, and no swelling of ankles.  ? ? ?Objective:  ?BP 133/88   Pulse 90   LMP 02/08/2021 (Approximate)   ?Appearance alert, well appearing, and in no distress. ?General exam BP noted to be well controlled today in office.  ? ? ?Assessment:   ?Blood Pressure well controlled.  ? ?Plan:  ?Current treatment plan is effective, no change in therapy. ?Advised of PP visit and glucose testing on 12/19/21. ? ?Natale Milch, RN ? ? ?Patient was assessed and managed by nursing staff during this encounter. I have reviewed the chart and agree with the documentation and plan. I have also made any necessary editorial changes. ? ?Jaynie Collins, MD ?11/24/2021 11:29 AM  ? ?

## 2021-11-27 ENCOUNTER — Other Ambulatory Visit (HOSPITAL_COMMUNITY): Payer: Self-pay

## 2021-11-27 ENCOUNTER — Telehealth (HOSPITAL_COMMUNITY): Payer: Self-pay | Admitting: *Deleted

## 2021-11-27 NOTE — Telephone Encounter (Signed)
Hospital Discharge Follow-Up Call: ? ?Spoke to patient with help of telephonic interpreter "Marcela [phone]".  Patient reports that she is well and is taking her blood pressure at home.  She also says she is taking her medicine to control blood pressure.  She was seen for a nurse visit in the office on November 24, 2021.  She has no other concerns about her healing process.  EPDS today was 1 and she endorses this accurately reflects that she is doing well emotionally. ? ?Patient says that baby is well and she has no concerns about baby's health.  She reports that baby sleeps in a crib.  Reviewed ABCs of Safe Sleep. ?

## 2021-12-05 NOTE — Progress Notes (Signed)
Patient was assessed and managed by nursing staff during this encounter. I have reviewed the chart and agree with the documentation and plan. I have also made any necessary editorial changes.  Scheryl Darter, MD 12/05/2021 1:59 PM

## 2021-12-19 ENCOUNTER — Ambulatory Visit (INDEPENDENT_AMBULATORY_CARE_PROVIDER_SITE_OTHER): Payer: Medicaid Other | Admitting: Obstetrics & Gynecology

## 2021-12-19 ENCOUNTER — Other Ambulatory Visit: Payer: Medicaid Other

## 2021-12-19 VITALS — BP 137/85 | HR 93 | Wt 243.4 lb

## 2021-12-19 DIAGNOSIS — Z3042 Encounter for surveillance of injectable contraceptive: Secondary | ICD-10-CM

## 2021-12-19 MED ORDER — MEDROXYPROGESTERONE ACETATE 150 MG/ML IM SUSP: 150.0000 mg | INTRAMUSCULAR | 4 refills | Status: AC

## 2021-12-19 NOTE — Progress Notes (Signed)
? ? ?  Post Partum Visit Note ? ?Katherine Alvarez is a 36 y.o. [phone] female who presents for a postpartum visit. She is 4 weeks postpartum following a normal spontaneous vaginal delivery.  I have fully reviewed the prenatal and intrapartum course. The delivery was at 39 gestational weeks.  Anesthesia: epidural. Postpartum course has been unremarkable. Baby is doing well. Baby is feeding by bottle - Daron Offer Soothepro . Bleeding  spotting . Bowel function is normal. Bladder function is normal. Patient is sexually active. Contraception method is Depo-Provera injections. Postpartum depression screening: negative. ?C/o sore throat and left neck and no fever for 2 days ? ?The pregnancy intention screening data noted above was reviewed. Potential methods of contraception were discussed. The patient elected to proceed with No data recorded. ? ? ? ?Health Maintenance Due  ?Topic Date Due  ? COVID-19 Vaccine (1) Never done  ? URINE MICROALBUMIN  Never done  ? ? ?The following portions of the patient's history were reviewed and updated as appropriate: allergies, current medications, past family history, past medical history, past social history, past surgical history, and problem list. ? ?Review of Systems ?Pertinent items are noted in HPI. ? ?Objective:  ?LMP 02/08/2021 (Approximate)   ? ?General:  alert, cooperative, and no distress  ? Breasts:  not indicated  ?Lungs: Normal effort  ?Heart:  regular rate and rhythm  ?Abdomen:    ?Wound   ?GU exam:  not indicated  ?    Throat and tonsils nl with mucus in posterior pharynx, tender node left neck, TM nl bilaterally, c/w viral pharyngitis, may use OTC cold remedies and Tylenol ?Assessment:  ? ? There are no diagnoses linked to this encounter. ? ?normal postpartum exam.  ? ?Plan:  ? ?Essential components of care per ACOG recommendations: ? ?1.  Mood and well being: Patient with negative depression screening today. Reviewed local resources for support.  ?-  Patient tobacco use? No.   ?- hx of drug use? No.   ? ?2. Infant care and feeding:  ?-Patient currently breastmilk feeding? No.  ?-Social determinants of health (SDOH) reviewed in EPIC. No concerns ? ?3. Sexuality, contraception and birth spacing ?- Patient does not want a pregnancy in the next year.  Desired family size is 3 children.  ?- Reviewed reproductive life planning. Reviewed contraceptive methods based on pt preferences and effectiveness.  Patient desired Hormonal Injection today.   ?- Discussed birth spacing of 18 months ? ?4. Sleep and fatigue ?-Encouraged family/partner/community support of 4 hrs of uninterrupted sleep to help with mood and fatigue ? ?5. Physical Recovery  ?- Discussed patients delivery and complications. She describes her labor as good. ?- Patient had a Vaginal, no problems at delivery. Patient had no laceration. Perineal healing reviewed. Patient expressed understanding ?- Patient has urinary incontinence? No. ?- Patient is safe to resume physical and sexual activity ? ?6.  Health Maintenance ?- HM due items addressed Yes ?- Last pap smear  ?Diagnosis  ?Date Value Ref Range Status  ?05/02/2021   Final  ? - Negative for intraepithelial lesion or malignancy (NILM)  ? Pap smear not done at today's visit.  ?-Breast Cancer screening indicated? no ? ?7. Chronic Disease/Pregnancy Condition follow up: Gestational Diabetes ? ?- PCP follow up-DM and CHTN ? ? ?Adam Phenix, MD ?[facility] for [facility], [facility]  ?

## 2021-12-20 LAB — GLUCOSE TOLERANCE, 2 HOURS
Glucose, 2 hour: 181 mg/dL — ABNORMAL HIGH (ref 70–139)
Glucose, GTT - Fasting: 125 mg/dL — ABNORMAL HIGH (ref 70–99)

## 2021-12-27 ENCOUNTER — Encounter (HOSPITAL_COMMUNITY): Payer: Self-pay

## 2021-12-27 ENCOUNTER — Ambulatory Visit (HOSPITAL_COMMUNITY)
Admission: EM | Admit: 2021-12-27 | Discharge: 2021-12-27 | Disposition: A | Discharge status: Home / Self Care | Payer: Medicaid Other | Attending: Physician Assistant | Admitting: Physician Assistant

## 2021-12-27 DIAGNOSIS — J358 Other chronic diseases of tonsils and adenoids: Secondary | ICD-10-CM | POA: Insufficient documentation

## 2021-12-27 DIAGNOSIS — J039 Acute tonsillitis, unspecified: Secondary | ICD-10-CM

## 2021-12-27 DIAGNOSIS — J029 Acute pharyngitis, unspecified: Secondary | ICD-10-CM | POA: Insufficient documentation

## 2021-12-27 LAB — CBG MONITORING, ED: Glucose-Capillary: 123 mg/dL — ABNORMAL HIGH (ref 70–99)

## 2021-12-27 LAB — POCT RAPID STREP A, ED / UC: Streptococcus, Group A Screen (Direct): NEGATIVE

## 2021-12-27 LAB — POCT INFECTIOUS MONO SCREEN, ED / UC: Mono Screen: NEGATIVE

## 2021-12-27 MED ORDER — METHYLPREDNISOLONE ACETATE 40 MG/ML IJ SUSP
INTRAMUSCULAR | Status: AC
  Filled 2021-12-27: qty 1

## 2021-12-27 MED ORDER — METHYLPREDNISOLONE ACETATE 40 MG/ML IJ SUSP
40.0000 mg | Freq: Once | INTRAMUSCULAR | Status: AC
  Administered 2021-12-27: 40 mg via INTRAMUSCULAR

## 2021-12-27 MED ORDER — LIDOCAINE HCL (PF) 1 % IJ SOLN
INTRAMUSCULAR | Status: AC
  Filled 2021-12-27: qty 2

## 2021-12-27 MED ORDER — AMOXICILLIN-POT CLAVULANATE 875-125 MG PO TABS: 1.0000 | ORAL_TABLET | Freq: Two times a day (BID) | ORAL | 0 refills | Status: AC

## 2021-12-27 MED ORDER — CEFTRIAXONE SODIUM 1 G IJ SOLR
INTRAMUSCULAR | Status: AC
  Filled 2021-12-27: qty 10

## 2021-12-27 MED ORDER — CEFTRIAXONE SODIUM 1 G IJ SOLR
1.0000 g | Freq: Once | INTRAMUSCULAR | Status: AC
Start: 2021-12-27 — End: 2021-12-27
  Administered 2021-12-27: 1 g via INTRAMUSCULAR

## 2021-12-27 NOTE — ED Provider Notes (Signed)
?[place] ? ? ? ?CSN: [id number] ?Arrival date & time: 12/27/21  0807 ? ? ?  ? ?History   ?Chief Complaint ?Chief Complaint  ?Patient presents with  ? Otalgia  ?  left  ? ? ?HPI ?Katherine Alvarez is a 36 y.o. female.  ? ?Patient presents today with a 5-day history of severe sore throat that has spread to her left ear yesterday prompting evaluation.  She is Spanish-speaking and utilized family member accompanying her to visit for translation after declining video interpreter.  She reports pain is rated 10 on a 0-10 pain scale, localized to the left posterior pharynx, described as sharp, worse with swallowing, no alleviating factors identified.  She denies any fever, headache, nausea, vomiting, difficulty speaking, muffled voice, swelling of her throat, shortness of breath.  She does report significant dysphagia/odynophagia related to pain.  She has has tried over-the-counter medication without symptom relief.  Denies any known sick contacts.  Denies any significant cough, nasal congestion.  She is confident she is not pregnant as she recently gave birth within the past few months and is now on Depo-Provera.  She is not breast-feeding.  She is having difficulty eating due to pain but is able to manage secretions.  Denies any jaw claudication, visual disturbance, temporal headache, headache. ? ? ?Past Medical History:  ?Diagnosis Date  ? Gestational diabetes   ? ? ?Patient Active Problem List  ? Diagnosis Date Noted  ? Language barrier affecting health care 05/02/2021  ? IUFD at less than 20 weeks of gestation 04/04/2021  ? ? ?Past Surgical History:  ?Procedure Laterality Date  ? CERVICAL CERCLAGE    ? ? ?OB History   ? ? Gravida  ?4  ? Para  ?3  ? Term  ?3  ? Preterm  ?   ? AB  ?1  ? Living  ?3  ?  ? ? SAB  ?1  ? IAB  ?   ? Ectopic  ?   ? Multiple  ?0  ? Live Births  ?3  ?   ?  ?  ? ? ? ?Home Medications   ? ?Prior to Admission medications   ?Medication Sig Start Date End Date Taking?  Authorizing Provider  ?amoxicillin-clavulanate (AUGMENTIN) 875-125 MG tablet Take 1 tablet by mouth every 12 (twelve) hours. 12/27/21  Yes Voyd Groft, Derry Skill, PA-C  ?acetaminophen (TYLENOL) 325 MG tablet Take 2 tablets (650 mg total) by mouth every 4 (four) hours as needed (for pain scale < 4). ?Patient not taking: Reported on 11/24/2021 11/17/21   Starr Lake, CNM  ?furosemide (LASIX) 20 MG tablet Take 1 tablet (20 mg total) by mouth daily. ?Patient not taking: Reported on 11/24/2021 11/17/21   Christin Fudge, CNM  ?ibuprofen (ADVIL) 600 MG tablet Take 1 tablet (600 mg total) by mouth every 6 (six) hours. ?Patient not taking: Reported on 11/24/2021 11/17/21   Christin Fudge, CNM  ?medroxyPROGESTERone (DEPO-PROVERA) 150 MG/ML injection Inject 1 mL (150 mg total) into the muscle every 3 (three) months. 12/19/21   Woodroe Mode, MD  ?NIFEdipine (ADALAT CC) 30 MG 24 hr tablet Take 1 tablet (30 mg total) by mouth daily. 11/18/21 01/17/22  Renard Matter, MD  ?Prenatal 27-1 MG TABS Take 1 tablet by mouth daily at 12 noon. 11/18/21   Starr Lake, CNM  ?Prenat-FeCbn-FeAsp-Meth-FA-DHA (PRENATE MINI) 18-0.6-0.4-350 MG CAPS Take 1 tablet by mouth daily at 8 pm. 04/24/21   Constant, Vickii Chafe, MD  ? ? ?Family  History ?Family History  ?Problem Relation Age of Onset  ? Cancer Neg Hx   ? Diabetes Neg Hx   ? Heart disease Neg Hx   ? ? ?Social History ?Social History  ? ?Tobacco Use  ? Smoking status: Never  ? Smokeless tobacco: Never  ?Vaping Use  ? Vaping Use: Never used  ?Substance Use Topics  ? Alcohol use: Not Currently  ? Drug use: Never  ? ? ? ?Allergies   ?Patient has no known allergies. ? ? ?Review of Systems ?Review of Systems  ?Constitutional:  Positive for activity change. Negative for appetite change, fatigue and fever.  ?HENT:  Positive for sore throat and trouble swallowing. Negative for congestion, sinus pressure, sneezing and voice change.   ?Eyes:  Negative for visual disturbance.   ?Respiratory:  Negative for cough and shortness of breath.   ?Cardiovascular:  Negative for chest pain.  ?Gastrointestinal:  Negative for abdominal pain, diarrhea, nausea and vomiting.  ?Neurological:  Negative for dizziness, light-headedness and headaches.  ? ? ?Physical Exam ?Triage Vital Signs ?ED Triage Vitals  ?Enc Vitals Group  ?   BP 12/27/21 0842 124/85  ?   Pulse Rate 12/27/21 0842 (!) 103  ?   Resp 12/27/21 0842 18  ?   Temp 12/27/21 0842 98.8 ?F (37.1 ?C)  ?   Temp Source 12/27/21 0842 Oral  ?   SpO2 12/27/21 0842 97 %  ?   Weight --   ?   Height --   ?   Head Circumference --   ?   Peak Flow --   ?   Pain Score 12/27/21 0844 10  ?   Pain Loc --   ?   Pain Edu? --   ?   Excl. in [place]? --   ? ?No data found. ? ?Updated Vital Signs ?BP 124/85 (BP Location: Left Arm)   Pulse (!) 103   Temp 98.8 ?F (37.1 ?C) (Oral)   Resp 18   SpO2 97%   Breastfeeding No  ? ?Visual Acuity ?Right Eye Distance:   ?Left Eye Distance:   ?Bilateral Distance:   ? ?Right Eye Near:   ?Left Eye Near:    ?Bilateral Near:    ? ?Physical Exam ?Vitals reviewed.  ?Constitutional:   ?   General: She is awake. She is not in acute distress. ?   Appearance: Normal appearance. She is well-developed. She is not ill-appearing.  ?   Comments: Very pleasant female appears stated age in no acute distress sitting comfortably in exam room  ?HENT:  ?   Head: Normocephalic and atraumatic.  ?   Right Ear: Tympanic membrane, ear canal and external ear normal. Tympanic membrane is not erythematous or bulging.  ?   Left Ear: Tympanic membrane, ear canal and external ear normal. Tympanic membrane is not erythematous or bulging.  ?   Nose:  ?   Right Sinus: No maxillary sinus tenderness or frontal sinus tenderness.  ?   Left Sinus: No maxillary sinus tenderness or frontal sinus tenderness.  ?   Mouth/Throat:  ?   Pharynx: Uvula midline. Posterior oropharyngeal erythema present. No oropharyngeal exudate.  ?   Tonsils: Tonsillar exudate present. 2+ on the  right. 3+ on the left.  ?Cardiovascular:  ?   Rate and Rhythm: Normal rate and regular rhythm.  ?   Heart sounds: Normal heart sounds, S1 normal and S2 normal. No murmur heard. ?Pulmonary:  ?   Effort: Pulmonary effort is normal.  ?  Breath sounds: Normal breath sounds. No wheezing, rhonchi or rales.  ?   Comments: Clear to auscultation bilaterally ?Lymphadenopathy:  ?   Head:  ?   Right side of head: No submental, submandibular or tonsillar adenopathy.  ?   Left side of head: Submandibular and tonsillar adenopathy present. No submental adenopathy.  ?   Cervical: No cervical adenopathy.  ?Psychiatric:     ?   Behavior: Behavior is cooperative.  ? ? ? ?UC Treatments / Results  ?Labs ?(all labs ordered are listed, but only abnormal results are displayed) ?Labs Reviewed  ?CBG MONITORING, ED - Abnormal; Notable for the following components:  ?    Result Value  ? Glucose-Capillary 123 (*)   ? All other components within normal limits  ?CULTURE, GROUP A STREP [facility])  ?POCT INFECTIOUS MONO SCREEN, ED / UC  ?POCT RAPID STREP A, ED / UC  ? ? ?EKG ? ? ?Radiology ?No results found. ? ?Procedures ?Procedures (including critical care time) ? ?Medications Ordered in UC ?Medications  ?methylPREDNISolone acetate (DEPO-MEDROL) injection 40 mg (has no administration in time range)  ?cefTRIAXone (ROCEPHIN) injection 1 g (1 g Intramuscular Given 12/27/21 1016)  ? ? ?Initial Impression / Assessment and Plan / UC Course  ?I have reviewed the triage vital signs and the nursing notes. ? ?Pertinent labs & imaging results that were available during my care of the patient were reviewed by me and considered in my medical decision making (see chart for details). ? ?  ? ?Concern for beginning of peritonsillar abscess given clinical presentation.  No evidence of airway involvement on clinical exam.  Patient was given injection of Rocephin and started on Augmentin twice daily.  Given severity of swelling and pain she was given steroid injection of  Depo-Medrol 40 mg.  She does have a history of gestational diabetes but blood sugar was 123 in clinic today.  She was instructed to avoid carbohydrates and drink plenty of fluid for the next several days.  She can use Tyleno

## 2021-12-27 NOTE — ED Triage Notes (Signed)
A few days of left jaw pain and swelling. Onset yesterday of left ear pain. Notes pain w/chewing. Has been taking flanex w/o relief.  ?

## 2021-12-27 NOTE — Discharge Instructions (Signed)
Your strep and mono testing are negative.  I am concerned about an infection of your tonsil.  We gave you an injection of antibiotics today.  Please start Augmentin twice daily for 10 days.  We have also given you an injection of steroids to help with pain and inflammation.  This can raise blood sugar so please avoid carbohydrates and drink plenty of fluid.  You can use over-the-counter medication including gargling with warm salt water and Tylenol for pain relief.  If your symptoms do not improve quickly please call to schedule an appointment with ENT.  If at any point anything worsens and you have worsening pain, high fever, inability to swallow, change in your voice, shortness of breath, throat swelling you need to go to the emergency room. ?

## 2021-12-28 LAB — CULTURE, GROUP A STREP (THRC)

## 2022-01-10 ENCOUNTER — Other Ambulatory Visit: Payer: Self-pay | Admitting: Family Medicine

## 2022-02-08 ENCOUNTER — Ambulatory Visit (INDEPENDENT_AMBULATORY_CARE_PROVIDER_SITE_OTHER): Payer: Medicaid Other

## 2022-02-08 DIAGNOSIS — Z3042 Encounter for surveillance of injectable contraceptive: Secondary | ICD-10-CM

## 2022-02-08 MED ORDER — MEDROXYPROGESTERONE ACETATE 150 MG/ML IM SUSP
150.0000 mg | Freq: Once | INTRAMUSCULAR | Status: AC
  Administered 2022-02-08: 150 mg via INTRAMUSCULAR

## 2022-02-08 NOTE — Progress Notes (Cosign Needed)
Patient presents for depo injection. Patient is within her window. Inj given in RD. Patient tolerated well. Patient states that she does not want to continue depo after today's injection due to weight gain. Patient scheduled for contraception change for around 3 months.

## 2022-04-11 NOTE — Progress Notes (Signed)
Patient was assessed and managed by nursing staff during this encounter. I have reviewed the chart and agree with the documentation and plan. I have also made any necessary editorial changes.  Scheryl Darter, MD 04/11/2022 2:20 PM

## 2022-04-12 ENCOUNTER — Ambulatory Visit (INDEPENDENT_AMBULATORY_CARE_PROVIDER_SITE_OTHER): Payer: Medicaid Other | Admitting: *Deleted

## 2022-04-12 DIAGNOSIS — Z3042 Encounter for surveillance of injectable contraceptive: Secondary | ICD-10-CM | POA: Diagnosis not present

## 2022-04-12 DIAGNOSIS — N912 Amenorrhea, unspecified: Secondary | ICD-10-CM

## 2022-04-12 LAB — POCT URINE PREGNANCY: Preg Test, Ur: NEGATIVE

## 2022-04-12 NOTE — Progress Notes (Signed)
Agree with nurses's documentation of this patient's clinic encounter.  Torrie Lafavor L, MD  

## 2022-04-12 NOTE — Patient Instructions (Signed)
AREA PEDIATRIC/FAMILY PRACTICE PHYSICIANS  Central/[place]outheast [place] ([address]) [facility] Chambliss, MD; Eniola, MD; Hale, MD; Hensel, MD; McDiarmid, MD; McIntyer, MD; Neal, MD; Walden, MD [address] [place], [place] [address] [phone] Mon-Fri 8:30-12:30, 1:30-5:00 Providers come to see babies at [facility] Accepting Medicaid [facility] providers who accept newborns: Koirala, MD; Morrow, MD; Wolters, MD [address], [place], [place] [address] [phone] Mon-Fri 8:00-5:30 Babies seen by providers at [facility] Does NOT accept Medicaid Please call early in hospitalization for appointment (limited availability)  [facility] Mulberry, MD [address] [place], [place] [address] [phone] Mon, Tue, Thur, Fri 8:30-5:00, Wed 10:00-7:00 (closed 1-2pm) Babies seen by [facility] providers Accepting Medicaid Rubin - Pediatrician Rubin, MD [address], [place], [place] [address] [phone] Mon-Fri 8:30-5:00, Sat 8:30-12:00 Provider comes to see babies at [facility] Accepting Medicaid Must have been referred from current patients or contacted office prior to delivery [facility] ([facility]) Brown, MD; Chandler, MD; Ettefagh, MD; Grant, MD; Lester, MD; McCormick, MD; McQueen, MD; Prose, MD; Simha, MD; Stanley, MD; Stryffeler, NP; Tebben, NP [address], [place], [place] [address] [phone] Mon, Tue, Thur, Fri 8:30-5:30, Wed 9:30-5:30, Sat 8:30-12:30 Babies seen by [facility] providers Accepting Medicaid Only accepting infants of first-time parents or siblings of current patients Hospital discharge coordinator will make follow-up appointment Jack Amos [address], [place], [place]  [address] [phone]   Fax - [phone] [facility] [address], [place], [place]  [address] Phone - [phone]   Fax - [phone] Shilpa Gosrani [address], [place], [place]  [address] [phone]  [place]ortheast [place] ([address]) [facility] Bates, MD; Brassfield, MD; Cooper, Cox, MD; MD; Davis, MD; Dovico, MD; Ettefaugh, MD; Little, MD; Lowe, MD; Keiffer, MD; Melvin, MD; Sumner, MD; Williams, MD [address], [place], [place] [address] [phone] Mon-Fri 8:30-5:00 (extended evenings Mon-Thur as needed), Sat-Sun 10:00-1:00 Providers come to see babies at [facility] Accepting Medicaid for families of first-time babies and families with all children in the household age 3 and under. Must register with office prior to making appointment (M-F only). [facility] Henson, NP; Knapp, MD; Lalonde, MD; Tysinger, PA [address] [place], [place] [address] [phone] Mon-Fri 8:00-5:00 Babies seen by providers at [facility] Does NOT accept Medicaid/Commercial Insurance Only [facility] ([facility])  Artis, MD; Barnes, MD; Bratton, MD; Coccaro, MD; Lockett Gardner, MD; Kramer, MD; Marshall, MD; Netherton, MD; Poleto, MD; Skinner, MD [address] [place], [place] [address] [phone] Mon-Fri 8:30-5:30, Sat (Oct.-Mar.) 9:00-1:00 Babies seen by providers at [facility] Accepting Medicaid  [place] ([address]) [facility] Reid, MD; Warner, MD [address], [place], [place] [address] [phone] Mon-Fri 8:30-5:00, Sat 8:30-12:00 Providers come to see babies at [facility] Does NOT accept Medicaid [facility] Becker, PA; Hagler, MD; Scifres, PA; Sun, MD; Swayne, MD [address], [place], [place] [address] [phone] Mon-Fri 8:00-5:00 Babies seen by providers at [facility] Does NOT accept Medicaid Only accepting babies of parents who  are patients Please call early in hospitalization for appointment (limited availability)  Pediatricians Clark, MD; Frye, MD; Kelleher, MD; Mack, NP; Miller, MD; O'Keller, MD; Patterson, NP; Pudlo, MD; Puzio, MD; Thomas, MD; Tucker, MD; Twiselton, MD [address], [place], [place] [address] [phone] Mon-Fri 8:00-5:00, Sat 9:00-12:00 Providers come to see babies at [facility] Does NOT accept Medicaid  [place] ([address]) [facility] providers accepting new patients: Brake, NP; Wharton, PA [address], [place], [place] [address] [phone] Mon-Fri 8:00-5:00 Babies seen by providers at [facility] Does NOT accept Medicaid Only accepting babies of parents who are patients Please call early in hospitalization for appointment (limited availability) [facility] Gay, MD; Quinlan, MD [address] [place], [place] [address] [phone] (press 1 to schedule appointment) Mon-Fri 8:00-5:00 Providers come to see babies at [facility] Does NOT accept Medicaid [facility] Mazer, MD [address] [place], [place] [address] [phone] Mon-Fri 8:30-5:00 (lunch 12:30-1:00), extended hours by appointment only Wed 5:00-6:30 Babies seen by [facility] providers Accepting Medicaid [facility] Banks, MD; Jordan, MD; Koberlein, MD [address], [place], [place] [address] [phone] Mon-Fri 8:00-5:00 Babies seen by [facility] providers Does NOT accept Medicaid [facility] Parker, MD; Hunter, MD; Wallace, DO [address] [place], [place] [address] [phone] Mon-Fri 8:00-5:00 Babies seen by [facility] providers Does NOT accept Medicaid [facility] Brandon, PA; Brecken, PA; Christy, NP; Dees, MD; DeClaire, MD; DeWeese, MD; Hansen, NP; Mills, NP; Parrish, NP; Smoot, NP; Summer, MD; Vapne,  MD [address] [place], [place] [address] [phone] Mon-Fri 8:30-5:00, Sat 10:00-1:00 Providers come to see babies at [facility] Does NOT accept Medicaid Free prenatal information session Tuesdays at 4:45pm [facility] Bouska, MD; Gordon, PA; Jeffery, PA; Weber, PA [address] [place] [address] [phone] Mon-Fri 7:30-5:30 Babies seen by [facility] providers [facility] [address], [place], [place]  [address] [phone]   Fax - [phone]  [place] ([address] & [address]) [facility] [place] [address] [place], [place] [address] [phone] Mon-Thur 8:00-6:00 Providers come to see babies at [facility] Accepting Medicaid [facility] Anderson, NP; Badger, MD; Beal, PA; Spencer, PA [address] [place], [place] [address] [phone] Mon-Thur 7:30-7:30, Fri 7:30-4:30 Babies seen by [facility] providers Accepting Medicaid [facility] Agbuya, MD; Klett, NP; Romgoolam, MD [address], [place], [place] [address] [phone] Mon-Fri 8:30-5:00, Sat 8:30-12:00 Providers come to see babies at [facility] Accepting Medicaid Must have "Meet & Greet" appointment at office prior to delivery [facility] ([facility]) McCord, MD; Wallace, MD; Wood, MD [address], [place], [place] [address] [phone] Mon-Wed 8:00-6:00, Thur-Fri 8:00-5:00, Sat 9:00-12:00 Providers come to see babies at [facility] Does NOT accept Medicaid Only accepting siblings of current patients [facility]  [address], [place], [place]  [address] [phone]   Fax - [phone] [facility] [address], [place], [place]  [address] [phone]   Fax -  [phone]  [place] ([address] & [address]) [facility] Cirigliano, DO; Matthews, DO [address] [place], [place] [address] [phone] Mon-Fri 7:00-5:00 Babies seen by [facility] providers Does NOT accept Medicaid [facility] Briscoe, MD; Howley, PA; Moreira, PA [address], [place], [place] [address] [phone] Mon-Fri 8:00-5:00 Babies seen by [facility] providers Accepting Medicaid [facility] Boyd, MD; Church, PA; Jones, NP; Osborn, PA [address], , [place] [address] [phone] Mon-Fri 8:00-5:00 Babies seen by providers at [facility] Accepting Medicaid  [place] ([address]) [facility] [place] Wendling, DO [address] [place], [place] [address] [phone] Mon-Fri 8:00-5:00 Babies seen by [facility] providers Does NOT accept Medicaid Limited availability, please call early in hospitalization to schedule follow-up [facility] Calderon, PA; Cummings, MD; Dillard, MD; Martin, PA; Olson, MD; VanDeven, PA [address], [place], [place] [address] [phone] Mon-Fri 8:30-5:00, Sat 9:00-12:00 Babies seen by providers at [facility] Accepting Medicaid Please register online then schedule online or call office [email or web address] ([facility]) Hunter, NP; Kumar, MD; Martin Rogers, PA [address], [place], [place] [address] [phone] Mon-Fri 8:00-5:00 Babies seen by providers at [facility] Accepting Medicaid [facility] ([facility]) Damascus, MD; Kristi Fleenor, NP; West, MD [address], [place], [place] [address] [phone] Mon-Fri 8:00-5:30, Sat&Sun by appointment (phones open at  8:30) Babies seen by [facility] providers Accepting Medicaid Must be a first-time baby or sibling of current patient [facility] [place]  [address], [place], [place]  [address] [phone]   Fax - [phone]  [place] ([address]) [facility] Brown, PA; Cowen, PA; Rice, MD; Helton, PA; Spry, MD [address] [place], [place] [address] [phone] Mon-Thur 8:00-7:00, Fri 8:00-5:00, Sat 8:00-12:00, Sun 9:00-12:00 Babies seen by [facility] providers Accepting Medicaid [facility] Coe-Goins, MD; Marshall, MD; Pierre-Louis, MD [address], [place], [place] [address] [phone] Mon-Thur 8:00-5:00 Babies seen by providers at [facility] Accepting Medicaid [facility] Bratton, MD; Coe-Goins, MD; Hayes, MD; Lewis, MD; List, MD; Lott, MD; Marshall, MD; Moran, MD; O'Neal, MD; Pierre-Louis, MD; Pitonzo, MD; Scholer, MD; Spangle, MD [address] [place], [place] [address] [phone] Mon-Fri 8:00-5:30, Sat (Oct.-Mar.) 9:00-1:00 Babies seen by providers at [facility] Accepting Medicaid Must fill out new patient packet, available online at [email or web address] [facility] ([facility]) Friddle, NP; Harris, NP; Kelly, NP; Logan, MD; Melvin, PA; Poth, MD; Ramadoss, MD; Stanton, NP [address], [place], [place] [address] [phone] Mon-Thur 8:00-5:30, Fri 8:00-5:00 Babies seen by providers at [facility] Accepting Medicaid  [place] ([address]) [facility] Dixon, PA; [place], MD; Pickard, MD; Tapia, PA [address], [place], [place] [address] [phone] Mon-Fri 8:00-5:00 Babies seen by providers at [facility] Accepting Medicaid   [place] ([address]) [facility] [place] Masneri, DO; Meyers, MD; Nelson, PA [address], [place], [place] [address] [phone] Mon-Fri 8:00-5:00 Babies seen by providers at [facility] Does NOT accept Medicaid Limited appointment availability, please call early in hospitalization  [facility] [place] Kunedd, DO; McGowen, MD [address], [place], [place] [address] [phone] Mon-Fri 8:00-5:00 Babies seen by [facility] providers Does NOT accept Medicaid [facility] [place] Cameron, MD; MacDonald, MD; Michaels, PA; Nayak, MD [address] [place] [address], [place], [place] [address] [phone] Mon-Fri 8:00-5:00 After hours clinic ([address] [place],  [address]) [phone] Mon-Fri 5:00-8:00, Sat 12:00-6:00, Sun 10:00-4:00 Babies seen by [facility] providers Accepting Medicaid [facility] [place] [address], [place], [place]  [address] [phone]   Fax - [phone]  [place] ([address]) [facility] [place] [facility] Andy, MD [address], [place], [place] [address] [phone] Mon-Fri 8:00-5:00 Babies seen by [facility] providers Does NOT accept Medicaid [facility] [place] ([facility] [place]) Eksir, MD [address], [place], [place] [address] [phone] Mon-Thur 8:00-7:00, Fri 8:00-5:00, Sat 8:00-12:00 Babies seen by providers at [facility] Accepting Medicaid - but does not have vaccinations in office (must be received elsewhere) Limited availability, please call early in hospitalization  [place] ([address]) [facility]  Charlene Flemming, MD [address], [place] [address] [phone]  Fax [phone]  [facility]  Kimberly Newton, MD, Annamarie Streilein, PA, Carla Hampton, PA [address] [place], [place]  [address] [phone] [facility]  [address], [place], [place] [address] [phone] [address], [place], [place] [address] [phone] (West Office)  [facility] [address], [place], [place] [address] [phone] [facility] [address] [place], [place], [place] [address] [phone] [facility] [address], [place], [place] [address] [phone] [facility] [address], [place], [place] [address] [phone] [facility] [address], [place], [place] [address] [phone] [facility] [address], [place], [place] [address] [phone] [facility]  [address], [place], [place] [address] [phone] Dr[facility] [address] [place] [address], [place], [place] [address] [phone] [facility] [address], [place], [place] [address] [phone] [facility] [address], [place],  [address] [phone]  

## 2022-04-12 NOTE — Progress Notes (Signed)
Interpreter ID# [id number]   Katherine Alvarez presents today for UPT.   LMP: no cycle on Depo    OBJECTIVE: Appears well, in no apparent distress.  OB History     Gravida  4   Para  3   Term  3   Preterm      AB  1   Living  3      SAB  1   IAB      Ectopic      Multiple  0   Live Births  3          Home UPT Result: In-Office UPT result:Negative   ASSESSMENT: Negative pregnancy test  PLAN Pt has follow up in office for [facility] change in August.

## 2022-04-30 ENCOUNTER — Encounter: Payer: Self-pay | Admitting: Obstetrics and Gynecology

## 2022-04-30 ENCOUNTER — Ambulatory Visit (INDEPENDENT_AMBULATORY_CARE_PROVIDER_SITE_OTHER): Payer: Medicaid Other | Admitting: Obstetrics and Gynecology

## 2022-04-30 VITALS — BP 121/85 | HR 86 | Wt 249.0 lb

## 2022-04-30 DIAGNOSIS — Z01812 Encounter for preprocedural laboratory examination: Secondary | ICD-10-CM

## 2022-04-30 DIAGNOSIS — Z3009 Encounter for other general counseling and advice on contraception: Secondary | ICD-10-CM

## 2022-04-30 DIAGNOSIS — Z30017 Encounter for initial prescription of implantable subdermal contraceptive: Secondary | ICD-10-CM

## 2022-04-30 LAB — POCT URINE PREGNANCY: Preg Test, Ur: NEGATIVE

## 2022-04-30 MED ORDER — ETONOGESTREL 68 MG ~~LOC~~ IMPL
68.0000 mg | DRUG_IMPLANT | Freq: Once | SUBCUTANEOUS | Status: AC
  Administered 2022-04-30: 68 mg via SUBCUTANEOUS

## 2022-04-30 NOTE — Addendum Note (Signed)
Addended by: Marya Landry D on: 04/30/2022 10:34 AM   Modules accepted: Orders

## 2022-04-30 NOTE — Progress Notes (Signed)
36 yo P3 with BMI 41 presenting for Nexplanon insertion. Patient is without any complaints. Depo-provera was last received 3 months ago and patient states that she is 10 days overdue. She desires to change birth control method.   Past Medical History:  Diagnosis Date   Gestational diabetes    Past Surgical History:  Procedure Laterality Date   CERVICAL CERCLAGE     Family History  Problem Relation Age of Onset   Cancer Neg Hx    Diabetes Neg Hx    Heart disease Neg Hx    Social History   Tobacco Use   Smoking status: Never   Smokeless tobacco: Never  Vaping Use   Vaping Use: Never used  Substance Use Topics   Alcohol use: Not Currently   Drug use: Never   ROS See pertinent in HPI. All other systems reviewed and non contributory  Blood pressure 121/85, pulse 86, weight 249 lb (112.9 kg), not currently breastfeeding. GENERAL: Well-developed, well-nourished female in no acute distress.  EXTREMITIES: No cyanosis, clubbing, or edema, 2+ distal pulses. NEURO: alert and oriented x 3  A/P 36 yo here for Nexplanon insertion Normal pap smear 05/02/21 Patient given informed consent, signed copy in the chart, time out was performed. Pregnancy test was negative. Appropriate time out taken.  Patient's left arm was prepped and draped in the usual sterile fashion.. The ruler used to measure and mark insertion area.  Patient was prepped with alcohol swab and then injected with 3 cc of 1% lidocaine with epinephrine.  Patient was prepped with betadine, Nexplanon removed form packaging.  Device confirmed in needle, then inserted full length of needle and withdrawn per handbook instructions.  Patient insertion site covered with a bandaid and [place].   Minimal blood loss.  Patient tolerated the procedure well.  Patient advised to use condoms for the next 3 weeks Patient was also made aware of irregular vaginal bleeding as a normal side effect of the Nexplanon which does not affect its  effectiveness

## 2022-04-30 NOTE — Progress Notes (Signed)
Pt is here for change in birth control. Pt is interested in Nexplanon.

## 2023-03-28 ENCOUNTER — Other Ambulatory Visit: Payer: Self-pay

## 2023-03-28 ENCOUNTER — Ambulatory Visit (INDEPENDENT_AMBULATORY_CARE_PROVIDER_SITE_OTHER): Payer: Medicaid Other | Admitting: *Deleted

## 2023-03-28 ENCOUNTER — Other Ambulatory Visit (HOSPITAL_COMMUNITY): Admission: RE | Admit: 2023-03-28 | Payer: Medicaid Other | Source: Ambulatory Visit | Admitting: Family Medicine

## 2023-03-28 VITALS — BP 130/92 | HR 81 | Ht 65.0 in | Wt 214.1 lb

## 2023-03-28 DIAGNOSIS — N898 Other specified noninflammatory disorders of vagina: Secondary | ICD-10-CM | POA: Diagnosis not present

## 2023-03-28 NOTE — Progress Notes (Signed)
Pt presents with c/o vaginal odor and itching. She has been having the problem for several months. Pt reports that she was initially seen for this problem @ her PCP office [facility]) due to not being able to get appt @ [facility] where she is an established pt. She was given diflucan and a cream for her itching however it did not solve the issue. She attempted to be seen there again for the continued problem and they declined to see her and stated that they would send her to a "specialist" and pt was referred to this office. Presumably the provider @ [facility] was not aware that she had previously received Ob/Gyn care @ [facility]). 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 She was told no treatment needed but she needed another pap in 1 year. Pt is very concerned about this and wants to know if she needs any prescription or treatment for the HPV. I spent a long time explaining HPV to pt and reassured her that based on the information provided, she does not require any further treatment. I advised that she schedule her next pap test @ [facility]. Self vaginal swab was obtained.  Pt advised she will be notified of results and treatment indicated if any via Mychart. She may call the office on Monday for results if she is not able understand any information received via Mychart. Pt voiced understanding of all information and instructions given.

## 2023-04-01 LAB — CERVICOVAGINAL ANCILLARY ONLY
Bacterial Vaginitis (gardnerella): NEGATIVE
Candida Glabrata: NEGATIVE
Candida Vaginitis: NEGATIVE
Chlamydia: NEGATIVE
Comment: NEGATIVE
Comment: NEGATIVE
Comment: NEGATIVE
Comment: NEGATIVE
Comment: NEGATIVE
Comment: NORMAL
Neisseria Gonorrhea: NEGATIVE
Trichomonas: NEGATIVE

## 2023-12-23 ENCOUNTER — Encounter: Payer: Self-pay | Admitting: Obstetrics & Gynecology

## 2023-12-23 ENCOUNTER — Other Ambulatory Visit (HOSPITAL_COMMUNITY)
Admission: RE | Admit: 2023-12-23 | Discharge: 2023-12-23 | Disposition: A | Discharge status: Home / Self Care | Source: Ambulatory Visit | Attending: Obstetrics and Gynecology | Admitting: Obstetrics and Gynecology

## 2023-12-23 ENCOUNTER — Ambulatory Visit (INDEPENDENT_AMBULATORY_CARE_PROVIDER_SITE_OTHER): Admitting: Obstetrics & Gynecology

## 2023-12-23 VITALS — BP 146/96 | HR 102 | Ht 65.0 in | Wt 222.0 lb

## 2023-12-23 DIAGNOSIS — Z758 Other problems related to medical facilities and other health care: Secondary | ICD-10-CM | POA: Diagnosis not present

## 2023-12-23 DIAGNOSIS — R87610 Atypical squamous cells of undetermined significance on cytologic smear of cervix (ASC-US): Secondary | ICD-10-CM | POA: Diagnosis present

## 2023-12-23 DIAGNOSIS — Z603 Acculturation difficulty: Secondary | ICD-10-CM

## 2023-12-23 DIAGNOSIS — R87810 Cervical high risk human papillomavirus (HPV) DNA test positive: Secondary | ICD-10-CM | POA: Insufficient documentation

## 2023-12-23 NOTE — Progress Notes (Signed)
    GYNECOLOGY PROGRESS NOTE  Subjective:    Patient ID: Katherine Alvarez, female    DOB: [birth year], 38 y.o.   MRN: [id number]  HPI  Patient is a 38 y.o. single [id number] here as a new patient. She is referred for colpo due to ASCUS + HR HPV pap last month. She has been monogamous for 3 years, 3 partners liffetime. She uses Nexplanon for contraception, placed 04/2022.  The following portions of the patient's history were reviewed and updated as appropriate: allergies, current medications, past family history, past medical history, past social history, past surgical history, and problem list.  Review of Systems Pertinent items are noted in HPI.   Objective:   Blood pressure (!) 145/100, pulse (!) 102, height 5\' 5"  (1.651 m), weight 222 lb (100.7 kg), last menstrual period 12/10/2023. Body mass index is 36.94 kg/m. Well nourished, well hydrated Latina, no apparent distress  Live interpretor present for visit.  UPT negative, consent signed, time out done Speculum placed. Cervix prepped with acetic acid. Transformation zone seen in its entirety. Colpo adequate. Colposcopic findings normal except for a small amount of acetowhite changes only at the 12 o'clock position. I biopsied this area and used silver nitrate to achieve hemostasis. ECC obtained. She tolerated the procedure well.    Assessment:   1. ASCUS with positive high risk HPV cervical   2. Language barrier affecting health care      Plan:   1. ASCUS with positive high risk HPV cervical (Primary)  - Surgical pathology( [place]/ [facility]) - she will be called with the results and plan  2. Language barrier affecting health care - Interpretor

## 2023-12-23 NOTE — Addendum Note (Signed)
 Addended by: Lyndee Hensen on: 12/23/2023 04:48 PM   Modules accepted: Orders

## 2023-12-26 LAB — SURGICAL PATHOLOGY

## 2024-01-01 ENCOUNTER — Telehealth: Payer: Self-pay

## 2024-01-01 NOTE — Telephone Encounter (Signed)
 S/w pt via spanish interpreter and advised of need for colpo, transferred to scheduler.

## 2024-01-07 ENCOUNTER — Other Ambulatory Visit: Payer: Self-pay

## 2024-01-07 NOTE — Progress Notes (Signed)
 Received staff message from Dr. Everardo Hitch that pt will need LEEP, instead of Colpo, appt updated

## 2024-01-28 ENCOUNTER — Encounter: Admitting: Obstetrics and Gynecology

## 2024-06-15 NOTE — Progress Notes (Signed)
 Pt c/o right ankle pain after falling 9/28

## 2024-06-15 NOTE — Progress Notes (Addendum)
 Subjective Patient ID: Katherine Alvarez is a 38 y.o. female.    Patient here for evaluation of right lower leg and ankle injury.  Patient reports she twisted her ankle yesterday at home.  Patient reports pain, bruising, and swelling.  She has used ice for treatment.  Pain has pain with weight bearing.    History provided by:  Patient Language interpreter used: Yes     Review of Systems  Constitutional:  Negative for chills, diaphoresis and fever.  Respiratory:  Negative for cough and wheezing.   Cardiovascular:  Negative for chest pain.  Gastrointestinal:  Negative for nausea and vomiting.  Musculoskeletal:  Positive for arthralgias, gait problem and joint swelling.  Skin:  Negative for wound.  Neurological:  Negative for numbness.    Patient History  Allergies: No Known Allergies  History reviewed. No pertinent past medical history. History reviewed. No pertinent surgical history. Social History   Socioeconomic History  . Marital status: Single    Spouse name: Not on file  . Number of children: Not on file  . Years of education: Not on file  . Highest education level: Not on file  Occupational History  . Not on file  Tobacco Use  . Smoking status: Never  . Smokeless tobacco: Never  Substance and Sexual Activity  . Alcohol use: Not on file  . Drug use: Not on file  . Sexual activity: Not on file  Other Topics Concern  . Not on file  Social History Narrative  . Not on file   History reviewed. No pertinent family history. No current outpatient medications on file prior to visit.   No current facility-administered medications on file prior to visit.    Objective  Vitals:   06/15/24 1825  BP: (!) 137/93  Pulse: 82  Resp: 18  Temp: 37.1 C (98.7 F)  TempSrc: Oral  SpO2: 99%  Weight: 112 kg  Height: 5' 8  PainSc:   8        OBGYN/Pregnancy Status: Implant      No results found.  Physical Exam Vitals and nursing note reviewed.   Constitutional:      General: She is not in acute distress.    Appearance: Normal appearance. She is not ill-appearing, toxic-appearing or diaphoretic.  HENT:     Head: Normocephalic and atraumatic.  Eyes:     Conjunctiva/sclera: Conjunctivae normal.     Pupils: Pupils are equal, round, and reactive to light.  Cardiovascular:     Rate and Rhythm: Normal rate and regular rhythm.     Pulses: Normal pulses.  Pulmonary:     Effort: Pulmonary effort is normal. No respiratory distress.  Musculoskeletal:     Right knee: No swelling. No tenderness.     Right lower leg: Swelling (right lateral distal lower leg) and tenderness (right lateral distal lower leg) present.     Right ankle: Swelling present. Tenderness present over the lateral malleolus and medial malleolus. Decreased range of motion. Normal pulse.     Right Achilles Tendon: Normal.     Right foot: Normal range of motion. No swelling or tenderness.  Skin:    General: Skin is warm.  Neurological:     Mental Status: She is alert.     Results for orders placed or performed in visit on 06/15/24  XR tibia fibula 2 views right   Narrative   FINDINGS:  The osseous structures are unremarkable. There is no fracture or periosteal reaction. There is no  focal bone lesion. Alignment is anatomic. There is no soft tissue swelling or foreign body identified.    Impression   No acute bone abnormality.  Electronically signed by: Honora Dempsey Mo, M.D. on 06/15/2024 20:04:33  XR ankle 3+ views right   Narrative   Ankle:  The ankle mortise is symmetric, with normal alignment. The talar dome is smooth in contour. The medial and lateral malleoli both appear intact. There is no fracture or focal bone lesion. Subtalar joints are within normal limits.     Impression   No acute bone abnormality.  Electronically signed by: Honora Dempsey Mo, M.D. on 06/15/2024 20:06:20       Procedures MDM:     1 Acute, uncomplicated illness or  injury     Explanation of Medical Decision Making and variances from expected care:  X rays negative.  Patient given crutches.  Advised on wrapping with ACE or Coban.  Coban wrap given in clinic.  Diclofenac prescribed.  Follow up precautions given    Risk:: Moderate            Assessment/Plan Diagnoses and all orders for this visit:  Pain in right lower leg -     XR tibia fibula 2 views right -     XR ankle 3+ views right  Right ankle swelling -     XR tibia fibula 2 views right -     XR ankle 3+ views right  Sprain of ankle, initial encounter -     diclofenac (Voltaren) 50 MG EC tablet; Take 1 tablet (50 mg total) by mouth in the morning and 1 tablet (50 mg total) in the evening. Do all this for 10 days. Do not crush, chew, or split. .        Patient Instructions  XR ankle 3+ views right Result Date: 06/15/2024 Narrative: Ankle: The ankle mortise is symmetric, with normal alignment. The talar dome is smooth in contour. The medial and lateral malleoli both appear intact. There is no fracture or focal bone lesion. Subtalar joints are within normal limits.   Impression: No acute bone abnormality. Electronically signed by: Honora Dempsey Mo, M.D. on 06/15/2024 20:06:20  XR tibia fibula 2 views right Result Date: 06/15/2024 Narrative: FINDINGS: The osseous structures are unremarkable. There is no fracture or periosteal reaction. There is no focal bone lesion. Alignment is anatomic. There is no soft tissue swelling or foreign body identified.   Impression: No acute bone abnormality. Electronically signed by: Honora Dempsey Mo, M.D. on 06/15/2024 20:04:33      Use muletas durante al menos ignacia prima. Tome diclofenaco segn lo prescrito para el dolor y la inflamacin. Puede vendar el tobillo diariamente segn sea necesario con vendaje ACE o COBAN. Consulte con un mdico si los sntomas empeoran o no mejoran.     Progress note signed by Eva Rase, PA on 06/15/24  at  9:39 PM

## 2024-07-28 ENCOUNTER — Telehealth: Payer: Self-pay

## 2024-07-28 NOTE — Telephone Encounter (Signed)
-----   Message from Katherine Alvarez sent at 07/28/2024  7:53 AM EST ----- She did not get the recommended LEEP. Can you see if she will at least come in for a pap smear in the next few months. Thanks

## 2024-07-28 NOTE — Telephone Encounter (Signed)
 Patient states she called the clinic to schedule appointment for procedure and was advised she did not need it at the moment, that she had to wait for a while and they would call her back? She is very thankful we have reached out and is willing to do what ever Dr. Starla is recommending. Please advise on what to schedule so I can help her get scheduled.

## 2024-07-28 NOTE — Telephone Encounter (Signed)
 Patient aware. Will call her ob/gyn to schedule pap in spring. Helped her reset her [employer].

## 2024-11-11 ENCOUNTER — Ambulatory Visit: Admitting: Obstetrics & Gynecology

## 2025-01-01 ENCOUNTER — Ambulatory Visit: Payer: Self-pay | Admitting: Obstetrics and Gynecology
